# Patient Record
Sex: Male | Born: 1950 | Race: Black or African American | Hispanic: No | Marital: Married | Smoking: Never smoker
Health system: Southern US, Community
[De-identification: ages and names within clinical notes are randomized; demographics above are authoritative.]

## PROBLEM LIST (undated history)

## (undated) DIAGNOSIS — R011 Cardiac murmur, unspecified: Secondary | ICD-10-CM

## (undated) DIAGNOSIS — I10 Essential (primary) hypertension: Secondary | ICD-10-CM

## (undated) DIAGNOSIS — T8859XA Other complications of anesthesia, initial encounter: Secondary | ICD-10-CM

## (undated) DIAGNOSIS — E782 Mixed hyperlipidemia: Secondary | ICD-10-CM

## (undated) DIAGNOSIS — R0602 Shortness of breath: Secondary | ICD-10-CM

## (undated) DIAGNOSIS — T4145XA Adverse effect of unspecified anesthetic, initial encounter: Secondary | ICD-10-CM

## (undated) DIAGNOSIS — I82409 Acute embolism and thrombosis of unspecified deep veins of unspecified lower extremity: Secondary | ICD-10-CM

## (undated) HISTORY — DX: Acute embolism and thrombosis of unspecified deep veins of unspecified lower extremity: I82.409

## (undated) HISTORY — PX: CATARACT EXTRACTION, BILATERAL: SHX1313

## (undated) HISTORY — PX: RETINAL DETACHMENT SURGERY: SHX105

## (undated) HISTORY — DX: Shortness of breath: R06.02

## (undated) HISTORY — DX: Mixed hyperlipidemia: E78.2

## (undated) HISTORY — DX: Cardiac murmur, unspecified: R01.1

---
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
## 2001-03-22 ENCOUNTER — Encounter: Payer: Self-pay | Admitting: Emergency Medicine

## 2001-03-22 ENCOUNTER — Emergency Department (HOSPITAL_COMMUNITY): Admission: EM | Admit: 2001-03-22 | Discharge: 2001-03-22 | Payer: Self-pay | Admitting: Emergency Medicine

## 2004-05-20 ENCOUNTER — Ambulatory Visit (HOSPITAL_COMMUNITY): Admission: RE | Admit: 2004-05-20 | Discharge: 2004-05-20 | Payer: Self-pay | Admitting: Ophthalmology

## 2004-11-21 ENCOUNTER — Ambulatory Visit: Payer: Self-pay | Admitting: Internal Medicine

## 2004-12-09 ENCOUNTER — Ambulatory Visit: Payer: Self-pay | Admitting: Internal Medicine

## 2004-12-09 ENCOUNTER — Ambulatory Visit (HOSPITAL_COMMUNITY): Admission: RE | Admit: 2004-12-09 | Discharge: 2004-12-09 | Payer: Self-pay | Admitting: Internal Medicine

## 2006-05-27 ENCOUNTER — Ambulatory Visit (HOSPITAL_COMMUNITY): Admission: RE | Admit: 2006-05-27 | Discharge: 2006-05-27 | Payer: Self-pay | Admitting: Family Medicine

## 2009-08-31 DIAGNOSIS — I82409 Acute embolism and thrombosis of unspecified deep veins of unspecified lower extremity: Secondary | ICD-10-CM

## 2009-08-31 HISTORY — DX: Acute embolism and thrombosis of unspecified deep veins of unspecified lower extremity: I82.409

## 2009-09-10 ENCOUNTER — Ambulatory Visit: Payer: Self-pay | Admitting: Orthopedic Surgery

## 2009-09-10 DIAGNOSIS — M1710 Unilateral primary osteoarthritis, unspecified knee: Secondary | ICD-10-CM | POA: Insufficient documentation

## 2009-09-10 DIAGNOSIS — IMO0002 Reserved for concepts with insufficient information to code with codable children: Secondary | ICD-10-CM

## 2009-09-12 ENCOUNTER — Encounter (INDEPENDENT_AMBULATORY_CARE_PROVIDER_SITE_OTHER): Payer: Self-pay | Admitting: Orthopedic Surgery

## 2009-09-12 ENCOUNTER — Ambulatory Visit: Admission: RE | Admit: 2009-09-12 | Discharge: 2009-09-12 | Payer: Self-pay | Admitting: Orthopedic Surgery

## 2009-09-12 ENCOUNTER — Ambulatory Visit: Payer: Self-pay | Admitting: Surgery

## 2009-09-17 ENCOUNTER — Encounter (INDEPENDENT_AMBULATORY_CARE_PROVIDER_SITE_OTHER): Payer: Self-pay | Admitting: Cardiovascular Disease

## 2009-09-17 ENCOUNTER — Ambulatory Visit: Payer: Self-pay | Admitting: Critical Care Medicine

## 2009-09-17 ENCOUNTER — Inpatient Hospital Stay (HOSPITAL_COMMUNITY): Admission: EM | Admit: 2009-09-17 | Discharge: 2009-09-25 | Payer: Self-pay | Admitting: Cardiovascular Disease

## 2009-09-20 ENCOUNTER — Ambulatory Visit: Payer: Self-pay | Admitting: Vascular Surgery

## 2009-09-20 ENCOUNTER — Encounter (INDEPENDENT_AMBULATORY_CARE_PROVIDER_SITE_OTHER): Payer: Self-pay | Admitting: Specialist

## 2009-09-21 ENCOUNTER — Encounter: Payer: Self-pay | Admitting: Critical Care Medicine

## 2009-10-08 ENCOUNTER — Ambulatory Visit: Payer: Self-pay | Admitting: Oncology

## 2009-11-08 ENCOUNTER — Ambulatory Visit (HOSPITAL_COMMUNITY): Admission: RE | Admit: 2009-11-08 | Discharge: 2009-11-08 | Payer: Self-pay | Admitting: Cardiology

## 2009-11-12 ENCOUNTER — Ambulatory Visit: Payer: Self-pay | Admitting: Oncology

## 2009-11-14 ENCOUNTER — Encounter: Payer: Self-pay | Admitting: Critical Care Medicine

## 2009-11-14 LAB — COMPREHENSIVE METABOLIC PANEL
AST: 23 U/L (ref 0–37)
Alkaline Phosphatase: 52 U/L (ref 39–117)
BUN: 13 mg/dL (ref 6–23)
CO2: 28 mEq/L (ref 19–32)
Sodium: 139 mEq/L (ref 135–145)
Total Bilirubin: 0.8 mg/dL (ref 0.3–1.2)

## 2009-11-14 LAB — CBC WITH DIFFERENTIAL/PLATELET
BASO%: 0.8 % (ref 0.0–2.0)
Basophils Absolute: 0 10*3/uL (ref 0.0–0.1)
EOS%: 4.6 % (ref 0.0–7.0)
Eosinophils Absolute: 0.2 10*3/uL (ref 0.0–0.5)
HCT: 37.3 % — ABNORMAL LOW (ref 38.4–49.9)
LYMPH%: 21.9 % (ref 14.0–49.0)
MONO#: 0.4 10*3/uL (ref 0.1–0.9)
MONO%: 8.1 % (ref 0.0–14.0)
RDW: 15.2 % — ABNORMAL HIGH (ref 11.0–14.6)

## 2009-11-14 LAB — D-DIMER, QUANTITATIVE: D-Dimer, Quant: 0.81 ug/mL-FEU — ABNORMAL HIGH (ref 0.00–0.48)

## 2009-11-14 LAB — PROTHROMBIN TIME
INR: 1.07 (ref <1.50)
Prothrombin Time: 13.8 seconds (ref 11.6–15.2)

## 2009-11-14 LAB — MORPHOLOGY

## 2009-11-14 LAB — LACTATE DEHYDROGENASE: LDH: 151 U/L (ref 94–250)

## 2009-11-19 LAB — PROTEIN S, ANTIGEN, FREE: Protein S Ag, Free: 65 % normal (ref 57–171)

## 2009-11-30 ENCOUNTER — Ambulatory Visit (HOSPITAL_COMMUNITY)
Admission: RE | Admit: 2009-11-30 | Discharge: 2009-11-30 | Payer: Self-pay | Source: Home / Self Care | Admitting: Cardiology

## 2009-11-30 DIAGNOSIS — D649 Anemia, unspecified: Secondary | ICD-10-CM

## 2009-11-30 DIAGNOSIS — T79A0XA Compartment syndrome, unspecified, initial encounter: Secondary | ICD-10-CM | POA: Insufficient documentation

## 2009-11-30 DIAGNOSIS — J479 Bronchiectasis, uncomplicated: Secondary | ICD-10-CM

## 2009-11-30 DIAGNOSIS — I82409 Acute embolism and thrombosis of unspecified deep veins of unspecified lower extremity: Secondary | ICD-10-CM | POA: Insufficient documentation

## 2009-11-30 DIAGNOSIS — I10 Essential (primary) hypertension: Secondary | ICD-10-CM | POA: Insufficient documentation

## 2009-11-30 DIAGNOSIS — E119 Type 2 diabetes mellitus without complications: Secondary | ICD-10-CM | POA: Insufficient documentation

## 2009-12-04 ENCOUNTER — Ambulatory Visit: Payer: Self-pay | Admitting: Internal Medicine

## 2009-12-05 ENCOUNTER — Telehealth (INDEPENDENT_AMBULATORY_CARE_PROVIDER_SITE_OTHER): Payer: Self-pay | Admitting: *Deleted

## 2010-01-02 ENCOUNTER — Ambulatory Visit (HOSPITAL_COMMUNITY): Admission: RE | Admit: 2010-01-02 | Discharge: 2010-01-02 | Payer: Self-pay | Admitting: Cardiovascular Disease

## 2010-02-07 ENCOUNTER — Ambulatory Visit: Payer: Self-pay | Admitting: Oncology

## 2010-02-11 LAB — CBC WITH DIFFERENTIAL/PLATELET
Basophils Absolute: 0 10*3/uL (ref 0.0–0.1)
EOS%: 4.6 % (ref 0.0–7.0)
Eosinophils Absolute: 0.3 10*3/uL (ref 0.0–0.5)
HCT: 41.6 % (ref 38.4–49.9)
HGB: 13.5 g/dL (ref 13.0–17.1)
LYMPH%: 32.7 % (ref 14.0–49.0)
MCHC: 32.5 g/dL (ref 32.0–36.0)
MCV: 71.4 fL — ABNORMAL LOW (ref 79.3–98.0)
MONO#: 0.6 10*3/uL (ref 0.1–0.9)
NEUT%: 50.9 % (ref 39.0–75.0)
Platelets: 183 10*3/uL (ref 140–400)
RBC: 5.82 10*6/uL (ref 4.20–5.82)
RDW: 14.3 % (ref 11.0–14.6)

## 2010-02-11 LAB — FERRITIN: Ferritin: 173 ng/mL (ref 22–322)

## 2010-02-11 LAB — IRON AND TIBC
TIBC: 262 ug/dL (ref 215–435)
UIBC: 182 ug/dL

## 2010-06-02 HISTORY — PX: KNEE ARTHROSCOPY: SHX127

## 2010-06-02 HISTORY — PX: LEG SURGERY: SHX1003

## 2010-06-23 ENCOUNTER — Encounter: Payer: Self-pay | Admitting: Cardiology

## 2010-07-02 NOTE — Progress Notes (Signed)
Summary: returning call  Phone Note Call from Patient Call back at Work Phone [phone]   Caller: Patient Call For: wert Summary of Call: Returning Leslie's call. Initial call taken by: Darletta Moll,  December 05, 2009 2:42 PM  Follow-up for Phone Call        pt aware of cxr results Follow-up by: Philipp Deputy CMA,  December 05, 2009 2:51 PM

## 2010-07-02 NOTE — Assessment & Plan Note (Signed)
Summary: LT KNEE PAIN,SWELLING/NEEDS XRAY/[facility]F   Visit Type:  new patient  CC:  left knee pain.  History of Present Illness: 60 years old African American male pain in the LEFT knee times a week.  This started with some swelling in the back of the knee progressed to pain and swelling in the calf.  The pain is described as dull worse with squatting seemed to come on suddenly better with ice.  He does get some catching and swelling.  It's better now.   Xrays today.  Medications: Metoprolol 50mg , Benicar 20mg , Niaspan ER 1,000 mg, Januvia 100mg , Lipitor 20mg , Ferrex 150.  Past History:  Past Surgical History: Tonsillectomy Detached retina x2 Cataract removed x2 Removed Para thyroid  Review of Systems Constitutional:  Denies weight loss, weight gain, fever, chills, and fatigue. Cardiovascular:  Complains of palpitations; denies chest pain, fainting, and murmurs. Respiratory:  Complains of snoring; denies short of breath, wheezing, couch, tightness, pain on inspiration, and snoring . Gastrointestinal:  Denies heartburn, nausea, vomiting, diarrhea, constipation, and blood in your stools. Genitourinary:  Denies frequency, urgency, difficulty urinating, painful urination, flank pain, and bleeding in urine. Neurologic:  Denies numbness, tingling, unsteady gait, dizziness, tremors, and seizure. Musculoskeletal:  Complains of joint pain, swelling, and stiffness; denies instability, redness, heat, and muscle pain. Endocrine:  Denies excessive thirst, exessive urination, and heat or cold intolerance. Psychiatric:  Denies nervousness, depression, anxiety, and hallucinations. Skin:  Denies changes in the skin, poor healing, rash, itching, and redness. HEENT:  Denies blurred or double vision, eye pain, redness, and watering. Immunology:  Denies seasonal allergies, sinus problems, and allergic to bee stings. Hemoatologic:  Denies easy bleeding and brusing.  Physical Exam  Additional Exam:   GEN: well developed, well nourished, normal grooming and hygiene, no deformity and normal body habitus.   CDV: pulses are normal, no edema, no erythema. no tenderness  Lymph: normal lymph nodes   Skin: no rashes, skin lesions or open sores   NEURO: normal coordination, reflexes, sensation.   Psyche: awake, alert and oriented. Mood normal   Gait: normal  LEFT knee the calf is swollen there appears to be just a slight knee joint effusion.  There is no joint line tenderness there is some crepitus on range of motion.  The patella has a grinding sensation on compression but no pain.  Range of motion is full.  Meniscal signs are negative, motor exam is normal.  Knee is stable  RIGHT knee has full range of motion no swelling or tenderness     Impression & Recommendations:  Problem # 1:  ARTHRITIS, LEFT KNEE (ICD-716.96) Assessment New  laboratory x-rays of the knee LEFT  Mild medial joint space narrowing, no osteophytes, minimal deformity.  Impression mild osteoarthritis LEFT knee  Orders: New Patient Level III (62952) Knee x-ray,  3 views (84132)  Patient Instructions: 1)  Please schedule a follow-up appointment as needed.

## 2010-07-02 NOTE — Assessment & Plan Note (Signed)
Summary: Pulmonary/ post hosp f/u bronchiectasis   Visit Type:  Hospital Follow-up Primary Provider/Referring Provider:  [facility] in [place]  CC:  Post hospital followup.  Pt states that he is doing well and denies any complaints today.Marland Kitchen  History of Present Illness: 60 yobm never smoker with bronchiectasis presenting with hemoptysis while on anticoagulation.  December 04, 2009  1st pulmonary office eval for f/u rul pna in setting of bronchiectasis complicated by hemoptysis while on anticoagulation.  Post hospital followup.  Pt states that he is doing well and denies any complaints today.  Pt denies any significant sore throat, dysphagia, itching, sneezing,  nasal congestion or excess secretions,  fever, chills, sweats, unintended wt loss, pleuritic or exertional cp, hempoptysis, change in activity tolerance  orthopnea pnd or leg swelling over baseline  Current Medications (verified): 1)  Niaspan 1000 Mg Cr-Tabs (Niacin (Antihyperlipidemic)) .Marland Kitchen.. 1 Once Daily 2)  Benicar 20 Mg Tabs (Olmesartan Medoxomil) .Marland Kitchen.. 1 Once Daily 3)  Aspirin 81 Mg Tbec (Aspirin) .Marland Kitchen.. 1 Once Daily 4)  Januvia 100 Mg Tabs (Sitagliptin Phosphate) .Marland Kitchen.. 1 Once Daily 5)  Lipitor 20 Mg Tabs (Atorvastatin Calcium) .Marland Kitchen.. 1 At Bedtime 6)  Toprol Xl 50 Mg Xr24h-Tab (Metoprolol Succinate) .Marland Kitchen.. 1 Once Daily 7)  Nu-Iron 150 Mg Caps (Polysaccharide Iron Complex) .Marland Kitchen.. 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)     - dx September 12 2009 ARTHRITIS, LEFT KNEE (ICD-716.96)  Family History: Metstatic kidney CA- Father  Social  History: Married Administrator, arts work Never smoker Occ ETOH  Vital Signs:  Patient profile:   60 year old male Height:      75 inches Weight:      220 pounds BMI:     27.60 O2 Sat:      98 % on Room air Temp:     98.2 degrees F oral Pulse rate:   65 / minute BP sitting:   130 / 62  (right arm) Cuff size:   large  Vitals Entered ByVernie Murders (December 04, 2009 11:40 AM)  O2 Flow:  Room air  Physical Exam  Additional Exam:  wt 220 December 04, 2009  Ambulatory healthy appearing bm nad  in no acute distress. HEENT: nl dentition, turbinates, and orophanx. Nl external ear canals without cough reflex Neck without JVD/Nodes/TM Lungs clear to A and P bilaterally without cough on insp or exp maneuvers RRR no s3 or murmur or increase in P2 Abd soft and benign with nl excursion in the supine position. No bruits or organomegaly Ext warm without calf tenderness, cyanosis clubbing or edema Skin warm and dry without lesions  .   CXR  Procedure date:  12/04/2009  Findings:      Comparison: 09/25/2009   Findings: Heart size is normal.   No pleural effusion or pulmonary edema.   No airspace consolidation identified.   The trachea appears midline.   The trachea manually and the bronchiectasis is better seen on CT from 09/21/2009.   IMPRESSION:   1.  No acute cardiopulmonary abnormalities. No change from 09/25/2009.  Impression & Recommendations:  Problem # 1:  BRONCHIECTASIS (ICD-494.0) I  had an extended discussion with the patient today lasting 15 to 20 minutes of a 25 minute visit on the following issues:  natural hx and complications reviewed.  See instructions for specific recommendations   Medications Added to Medication List This Visit: 1)  Niaspan 1000 Mg Cr-tabs (Niacin (antihyperlipidemic)) .Marland Kitchen.. 1 once daily 2)  Benicar 20 Mg Tabs (Olmesartan medoxomil) .Marland Kitchen.. 1 once daily 3)  Aspirin 81 Mg Tbec (Aspirin) .Marland Kitchen.. 1 once daily 4)  Januvia 100 Mg Tabs (Sitagliptin  phosphate) .Marland Kitchen.. 1 once daily 5)  Lipitor 20 Mg Tabs (Atorvastatin calcium) .Marland Kitchen.. 1 at bedtime 6)  Toprol Xl 50 Mg Xr24h-tab (Metoprolol succinate) .Marland Kitchen.. 1 once daily 7)  Nu-iron 150 Mg Caps (Polysaccharide iron complex) .Marland Kitchen.. 1 once daily  Other Orders: T-2 View CXR (71020TC) Est. Patient Level IV (40102)  Patient Instructions: 1)  Baby aspirin is fine one daily but even it should be stopped immediately if you have any bleeding 2)  This condition = bronchiectasis  is associcated with pneumonia so you need pneumovax twice (if had it several years ago need it repeated 5 years from that point 3)  Seek help for pulmonary infections

## 2010-07-05 NOTE — Letter (Signed)
Summary: History form  History form   Imported By: Jacklynn Ganong 09/11/2009 13:14:02  _____________________________________________________________________  External Attachment:    Type:   Image     Comment:   External Document

## 2010-07-05 NOTE — Consult Note (Signed)
Summary: [place]  [place]   Imported By: Sherian Rein 12/05/2009 08:41:39  _____________________________________________________________________  External Attachment:    Type:   Image     Comment:   External Document

## 2010-08-01 DIAGNOSIS — R0602 Shortness of breath: Secondary | ICD-10-CM

## 2010-08-01 HISTORY — DX: Shortness of breath: R06.02

## 2010-08-20 LAB — ABO/RH: ABO/RH(D): O POS

## 2010-08-20 LAB — PROTIME-INR
INR: 1.07 (ref 0.00–1.49)
INR: 1.14 (ref 0.00–1.49)
INR: 1.83 — ABNORMAL HIGH (ref 0.00–1.49)
Prothrombin Time: 14.5 seconds (ref 11.6–15.2)
Prothrombin Time: 15.1 seconds (ref 11.6–15.2)
Prothrombin Time: 15.1 seconds (ref 11.6–15.2)
Prothrombin Time: 15.4 seconds — ABNORMAL HIGH (ref 11.6–15.2)

## 2010-08-20 LAB — GLUCOSE, CAPILLARY
Glucose-Capillary: 113 mg/dL — ABNORMAL HIGH (ref 70–99)
Glucose-Capillary: 114 mg/dL — ABNORMAL HIGH (ref 70–99)
Glucose-Capillary: 117 mg/dL — ABNORMAL HIGH (ref 70–99)
Glucose-Capillary: 123 mg/dL — ABNORMAL HIGH (ref 70–99)
Glucose-Capillary: 125 mg/dL — ABNORMAL HIGH (ref 70–99)
Glucose-Capillary: 128 mg/dL — ABNORMAL HIGH (ref 70–99)
Glucose-Capillary: 146 mg/dL — ABNORMAL HIGH (ref 70–99)
Glucose-Capillary: 156 mg/dL — ABNORMAL HIGH (ref 70–99)
Glucose-Capillary: 158 mg/dL — ABNORMAL HIGH (ref 70–99)
Glucose-Capillary: 158 mg/dL — ABNORMAL HIGH (ref 70–99)
Glucose-Capillary: 162 mg/dL — ABNORMAL HIGH (ref 70–99)
Glucose-Capillary: 164 mg/dL — ABNORMAL HIGH (ref 70–99)
Glucose-Capillary: 189 mg/dL — ABNORMAL HIGH (ref 70–99)
Glucose-Capillary: 190 mg/dL — ABNORMAL HIGH (ref 70–99)
Glucose-Capillary: 242 mg/dL — ABNORMAL HIGH (ref 70–99)
Glucose-Capillary: 252 mg/dL — ABNORMAL HIGH (ref 70–99)

## 2010-08-20 LAB — CBC
HCT: 26.2 % — ABNORMAL LOW (ref 39.0–52.0)
HCT: 26.8 % — ABNORMAL LOW (ref 39.0–52.0)
HCT: 27.2 % — ABNORMAL LOW (ref 39.0–52.0)
HCT: 27.7 % — ABNORMAL LOW (ref 39.0–52.0)
HCT: 28.6 % — ABNORMAL LOW (ref 39.0–52.0)
Hemoglobin: 11.1 g/dL — ABNORMAL LOW (ref 13.0–17.0)
Hemoglobin: 8.8 g/dL — ABNORMAL LOW (ref 13.0–17.0)
Hemoglobin: 8.9 g/dL — ABNORMAL LOW (ref 13.0–17.0)
MCHC: 32.3 g/dL (ref 30.0–36.0)
MCHC: 32.6 g/dL (ref 30.0–36.0)
MCHC: 32.9 g/dL (ref 30.0–36.0)
MCHC: 33.1 g/dL (ref 30.0–36.0)
MCHC: 33.1 g/dL (ref 30.0–36.0)
MCV: 74 fL — ABNORMAL LOW (ref 78.0–100.0)
MCV: 74.2 fL — ABNORMAL LOW (ref 78.0–100.0)
MCV: 75.3 fL — ABNORMAL LOW (ref 78.0–100.0)
MCV: 75.4 fL — ABNORMAL LOW (ref 78.0–100.0)
MCV: 75.5 fL — ABNORMAL LOW (ref 78.0–100.0)
Platelets: 146 10*3/uL — ABNORMAL LOW (ref 150–400)
Platelets: 155 10*3/uL (ref 150–400)
Platelets: 172 10*3/uL (ref 150–400)
Platelets: 187 10*3/uL (ref 150–400)
Platelets: 188 10*3/uL (ref 150–400)
RBC: 3.56 MIL/uL — ABNORMAL LOW (ref 4.22–5.81)
RBC: 3.84 MIL/uL — ABNORMAL LOW (ref 4.22–5.81)
RBC: 4.23 MIL/uL (ref 4.22–5.81)
RBC: 4.53 MIL/uL (ref 4.22–5.81)
RDW: 13.5 % (ref 11.5–15.5)
RDW: 13.7 % (ref 11.5–15.5)
RDW: 14.3 % (ref 11.5–15.5)
RDW: 14.4 % (ref 11.5–15.5)
RDW: 14.6 % (ref 11.5–15.5)
WBC: 7.5 10*3/uL (ref 4.0–10.5)
WBC: 8.5 10*3/uL (ref 4.0–10.5)
WBC: 8.6 10*3/uL (ref 4.0–10.5)
WBC: 8.8 10*3/uL (ref 4.0–10.5)
WBC: 9 10*3/uL (ref 4.0–10.5)

## 2010-08-20 LAB — BASIC METABOLIC PANEL
BUN: 10 mg/dL (ref 6–23)
BUN: 11 mg/dL (ref 6–23)
BUN: 11 mg/dL (ref 6–23)
BUN: 12 mg/dL (ref 6–23)
BUN: 9 mg/dL (ref 6–23)
CO2: 26 mEq/L (ref 19–32)
CO2: 27 mEq/L (ref 19–32)
CO2: 29 mEq/L (ref 19–32)
Calcium: 8.1 mg/dL — ABNORMAL LOW (ref 8.4–10.5)
Calcium: 8.4 mg/dL (ref 8.4–10.5)
Calcium: 8.5 mg/dL (ref 8.4–10.5)
Chloride: 102 mEq/L (ref 96–112)
Chloride: 106 mEq/L (ref 96–112)
Creatinine, Ser: 0.89 mg/dL (ref 0.4–1.5)
Creatinine, Ser: 0.92 mg/dL (ref 0.4–1.5)
Creatinine, Ser: 1.13 mg/dL (ref 0.4–1.5)
GFR calc Af Amer: >60 mL/min (ref >60)
GFR calc Af Amer: >60 mL/min (ref >60)
GFR calc non Af Amer: >60 mL/min (ref >60)
GFR calc non Af Amer: >60 mL/min (ref >60)
Glucose, Bld: 121 mg/dL — ABNORMAL HIGH (ref 70–99)
Glucose, Bld: 121 mg/dL — ABNORMAL HIGH (ref 70–99)
Glucose, Bld: 190 mg/dL — ABNORMAL HIGH (ref 70–99)
Glucose, Bld: 194 mg/dL — ABNORMAL HIGH (ref 70–99)
Potassium: 4 mEq/L (ref 3.5–5.1)
Potassium: 4.2 mEq/L (ref 3.5–5.1)
Potassium: 4.2 mEq/L (ref 3.5–5.1)
Potassium: 4.4 mEq/L (ref 3.5–5.1)
Sodium: 136 mEq/L (ref 135–145)
Sodium: 138 mEq/L (ref 135–145)

## 2010-08-20 LAB — COMPREHENSIVE METABOLIC PANEL
ALT: 66 U/L — ABNORMAL HIGH (ref 0–53)
AST: 40 U/L — ABNORMAL HIGH (ref 0–37)
Alkaline Phosphatase: 46 U/L (ref 39–117)
BUN: 9 mg/dL (ref 6–23)
CO2: 28 mEq/L (ref 19–32)
CO2: 29 mEq/L (ref 19–32)
Chloride: 104 mEq/L (ref 96–112)
Chloride: 106 mEq/L (ref 96–112)
Creatinine, Ser: 0.9 mg/dL (ref 0.4–1.5)
GFR calc Af Amer: >60 mL/min (ref >60)
GFR calc non Af Amer: >60 mL/min (ref >60)
GFR calc non Af Amer: >60 mL/min (ref >60)
Glucose, Bld: 166 mg/dL — ABNORMAL HIGH (ref 70–99)
Potassium: 4 mEq/L (ref 3.5–5.1)
Sodium: 137 mEq/L (ref 135–145)
Sodium: 139 mEq/L (ref 135–145)
Total Bilirubin: 0.5 mg/dL (ref 0.3–1.2)
Total Protein: 6.8 g/dL (ref 6.0–8.3)

## 2010-08-20 LAB — CROSSMATCH: Antibody Screen: NEGATIVE

## 2010-08-20 LAB — URINALYSIS, MICROSCOPIC ONLY
Ketones, ur: NEGATIVE mg/dL
Leukocytes, UA: NEGATIVE
Nitrite: NEGATIVE
Protein, ur: NEGATIVE mg/dL
Urobilinogen, UA: 0.2 mg/dL (ref 0.0–1.0)

## 2010-08-20 LAB — BRAIN NATRIURETIC PEPTIDE
Pro B Natriuretic peptide (BNP): 31 pg/mL (ref 0.0–100.0)
Pro B Natriuretic peptide (BNP): 52 pg/mL (ref 0.0–100.0)

## 2010-08-20 LAB — CULTURE, BLOOD (ROUTINE X 2)

## 2010-08-20 LAB — MAGNESIUM: Magnesium: 2.2 mg/dL (ref 1.5–2.5)

## 2010-08-20 LAB — URINE CULTURE
Colony Count: NO GROWTH
Culture: NO GROWTH

## 2010-08-20 LAB — TSH: TSH: 4.563 u[IU]/mL — ABNORMAL HIGH (ref 0.350–4.500)

## 2010-08-20 LAB — APTT
aPTT: 42 seconds — ABNORMAL HIGH (ref 24–37)
aPTT: 52 seconds — ABNORMAL HIGH (ref 24–37)

## 2010-08-20 LAB — T3, FREE: T3, Free: 2.4 pg/mL (ref 2.3–4.2)

## 2010-08-20 LAB — T4, FREE: Free T4: 1.06 ng/dL (ref 0.80–1.80)

## 2010-10-01 ENCOUNTER — Ambulatory Visit (INDEPENDENT_AMBULATORY_CARE_PROVIDER_SITE_OTHER): Payer: 59 | Admitting: Urology

## 2010-10-01 DIAGNOSIS — R3129 Other microscopic hematuria: Secondary | ICD-10-CM

## 2010-10-01 DIAGNOSIS — R972 Elevated prostate specific antigen [PSA]: Secondary | ICD-10-CM

## 2010-10-16 ENCOUNTER — Ambulatory Visit (HOSPITAL_COMMUNITY)
Admission: RE | Admit: 2010-10-16 | Discharge: 2010-10-16 | Disposition: A | Discharge status: Home / Self Care | Payer: 59 | Source: Ambulatory Visit | Attending: Family Medicine | Admitting: Family Medicine

## 2010-10-16 ENCOUNTER — Other Ambulatory Visit (HOSPITAL_COMMUNITY): Payer: Self-pay | Admitting: Family Medicine

## 2010-10-16 ENCOUNTER — Encounter (HOSPITAL_COMMUNITY): Payer: Self-pay

## 2010-10-16 DIAGNOSIS — R059 Cough, unspecified: Secondary | ICD-10-CM | POA: Insufficient documentation

## 2010-10-16 DIAGNOSIS — R05 Cough: Secondary | ICD-10-CM

## 2010-10-16 HISTORY — DX: Essential (primary) hypertension: I10

## 2010-10-18 NOTE — Consult Note (Signed)
NAME:  Jordan Goodwin, Jordan Goodwin NO.:  [id number]   MEDICAL RECORD NO.:  [id number]          PATIENT TYPE:  AMB   LOCATION:                                FACILITY:  [facility]   PHYSICIAN:  Lionel December, M.D.    DATE OF BIRTH:  06-Oct-1950   DATE OF CONSULTATION:  11/21/2004  DATE OF DISCHARGE:                                   CONSULTATION   GASTROENTEROLOGY CONSULTATION   REASON FOR CONSULTATION:  Rectal pain, colonoscopy.   REFERRING PHYSICIAN:  Melony Overly, PA with Patrica Duel, M.D.   CONSULTING PHYSICIAN:  Lionel December, M.D.   HISTORY OF PRESENT ILLNESS:  The patient is a 60 year old black gentleman  who presents today for further evaluation of rectal pain at the request of  Melony Overly, PA-C with Dr. Patrica Duel.  The patient was seen in their  office about one month ago. He had a one week history of rectal pain at that  time.  He felt fullness and pressure in his rectum.  Examination revealed  what appeared to be a somewhat firm prostate but no distinct nodules.  He  had never had a colonoscopy therefore it was recommended that he come to see  [place] and he has also been seen by the urologist, Dr. Earlene Plater.  He states his  prostate work up has been negative.  Currently he is not having any rectal  pressure or pain.  His bowel movements have been regular.  No melena or  rectal bleeding, abdominal pain, heart burn, weight loss.  He has had some  dysphagia intermittently for several months.  Sometimes he has to wash the  food down with liquid.  It tends to be worse with solid foods such as  breads, rice or meat.  He had partial parathyroidectomy in March of this  year but notes that his dysphagia was prior to this.   CURRENT MEDICATIONS:  1.  Toprol XL 50 mg daily.  2.  Lipitor 20 mg daily.   ALLERGIES:  No known drug allergies.   PAST MEDICAL HISTORY:  Irregular heart rate, hypercholesterolemia,  parathyroid disease, status post partial parathyroidectomy.  He has  had  detached retina in both eyes requiring surgery and bilateral cataract  surgery.   FAMILY HISTORY:  Father had cancer of kidney and lung, question primary.  No  family history of colorectal cancer.   SOCIAL HISTORY:  He is married.  He works at [employer]. He has never been  a smoker.  He occasionally consumes alcohol, usually socially.   REVIEW OF SYMPTOMS:  See history of present illness for GASTROINTESTINAL and  CONSTITUTIONAL.  CARDIOPULMONARY:  Denies chest pain or shortness of breath.   PHYSICAL EXAMINATION:  VITAL SIGNS:  Weight 233. Height 6 feet 3 inches,  temperature 98.3, blood pressure 152/96, pulse 70.  GENERAL APPEARANCE:  Pleasant, well-nourished, well-developed black  gentleman in no acute distress.  SKIN:  Warm and dry, no jaundice.  HEENT:  Conjunctiva pink.  Sclerae nonicteric.  Oropharyngeal mucosa moist  and pink.  No lesions, erythema or exudate.  NECK:  No lymphadenopathy or  thyromegaly.  CHEST:  Lungs clear to auscultation.  CARDIAC:  Examination reveals regular rate and rhythm with normal S1, S2, no  murmurs, rubs or gallops.  ABDOMEN:  Positive bowel sounds, soft, nontender, nondistended, no  organomegaly or masses.  No rebound tenderness or guarding.  No abdominal  bruits or hernias.  EXTREMITIES:  No edema.  RECTAL:  Examination deferred.   IMPRESSION:  Mr. Bothwell is a 60 year old gentleman with recent  development of rectal pain/pressure which at this time has resolved.  He has  never had a colonoscopy therefore recommend one for further evaluation and  as well for a screening maneuver.  He complains of intermittent dysphagia to  solid foods and may have developed esophageal stricture or ring.   PLAN:  Colonoscopy and esophagogastroduodenoscopy, possible esophageal  dilatation, in the near future.  I have discussed risks, alternatives and  benefits with the patient with regards to reactions to medications,  bleeding, infection, perforation  or incomplete examination with patient and  he is agreeable to proceed.       LL/MEDQ  D:  11/21/2004  T:  11/21/2004  Job:  [id number]

## 2010-10-18 NOTE — Op Note (Signed)
NAME:  Jordan Goodwin, Jordan Goodwin NO.:  [id number]   MEDICAL RECORD NO.:  [id number]          PATIENT TYPE:  AMB   LOCATION:  DAY                           FACILITY:  [facility]   PHYSICIAN:  Lionel December, M.D.    DATE OF BIRTH:  1950/10/03   DATE OF PROCEDURE:  12/09/2004  DATE OF DISCHARGE:                                 OPERATIVE REPORT   PROCEDURE:  Esophagogastroduodenoscopy with esophageal dilation followed by  colonoscopy.   INDICATION:  Jordan Goodwin is 60 year old Afro-American male with intermittent  dysphagia of one years' duration primarily to solids without heartburn. He  also gives history of rectal pain which has improved. He is undergoing  colonoscopy first primarily for screening purposes. Procedures were reviewed  the patient, informed consent was obtained.   PREMEDICATION:  Cetacaine spray for oropharyngeal topical anesthesia,  Demerol 50 milligrams IV, Versed 7 milligrams IV in divided dose.   FINDINGS:  Procedure performed in endoscopy suite. The patient's vital signs  and O2 sat were monitored during procedure and remained stable.   PROCEDURE #1:  Esophagogastroduodenoscopy. The patient was placed left  lateral position. Olympus videoscope was passed via oropharynx without any  difficulty into esophagus.   Esophagus. Mucosa of the esophagus was normal throughout. Squamocolumnar  junction was at 42 cm from incisors and appeared to be unremarkable. No  hernia was noted.   Duodenum.  Bulbar mucosa was normal.   Stomach was empty and distended very well with insufflation. Folds of the  proximal stomach were normal. Examination mucosa at body, antrum, pyloric  channel as well as angularis, fundus and cardia was normal.   Duodenum bulbar mucosa was normal. Scope was passed second part of duodenum  where mucosa and folds were normal. Endoscope was withdrawn.   Esophagus was dilated by passing 56-French Maloney dilator to full  insertion. As the dilator was  withdrawn, endoscope was passed. Again there  was a small tear at cervical esophagus indicative of the web. Endoscope was  withdrawn. The patient prepared for procedure #2.   Colonoscopy. Rectal examination performed. No abnormality noted external or  digital exam. Olympus videoscope was placed rectum and advanced under vision  into sigmoid colon beyond. Preparation was excellent. There were few small  scattered diverticula at sigmoid and descending colon. Scope was passed  cecum which was identified by appendiceal orifice and ileocecal valve.  Pictures taken for the record. As the scope was withdrawn, colonic mucosa  was examined for the second time and there were no polyps or other  abnormalities. Rectal mucosa similarly was normal. Scope was retroflexed to  examine anorectal junction which was unremarkable. Endoscope was  straightened and withdrawn. The patient tolerated the procedure well.   FINAL DIAGNOSIS:  Normal esophagogastroduodenoscopy. Passage of 56-French  [facility] dilator.   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  Few small diverticula  at sigmoid and descending colon.   RECOMMENDATIONS:  High-fiber diet. Citrucel one tablespoonful daily. Levsin  SL one to tablets t.i.d. p.r.n. prescription given for 30 doses.   If the rectal  pain recurs will consider further workup.       NR/MEDQ  D:  12/09/2004  T:  12/09/2004  Job:  [id number]   cc:   Melony Overly, PA   Patrica Duel, M.D.  [address]  [place]  [place] [address]  Fax: [phone]

## 2011-04-08 ENCOUNTER — Ambulatory Visit (INDEPENDENT_AMBULATORY_CARE_PROVIDER_SITE_OTHER): Payer: 59 | Admitting: Urology

## 2011-04-08 DIAGNOSIS — R972 Elevated prostate specific antigen [PSA]: Secondary | ICD-10-CM

## 2011-04-08 DIAGNOSIS — R3129 Other microscopic hematuria: Secondary | ICD-10-CM

## 2012-05-25 ENCOUNTER — Ambulatory Visit (INDEPENDENT_AMBULATORY_CARE_PROVIDER_SITE_OTHER): Payer: 59 | Admitting: Urology

## 2012-05-25 DIAGNOSIS — R972 Elevated prostate specific antigen [PSA]: Secondary | ICD-10-CM

## 2012-06-23 IMAGING — US US EXTREM LOW VENOUS*L*
1 series · 14 of 24 positions shown · non-contrast
Comparison: 11/30/2009

CLINICAL DATA: Pain and swelling left lower extremity, history of
compartment syndrome

LEFT LOWER EXTREMITY VENOUS DUPLEX ULTRASOUND
TECHNIQUE: Gray-scale sonography with graded compression, as well
as color Doppler and duplex ultrasound, were performed to evaluate
the deep venous system of the lower extremity from the level of the
common femoral vein through the popliteal and proximal calf veins.
Spectral Doppler was utilized to evaluate flow at rest and with
distal augmentation maneuvers.

[Series 1: us extrem low venous*left* · 14 of 33 slices shown]
[im 1/33]
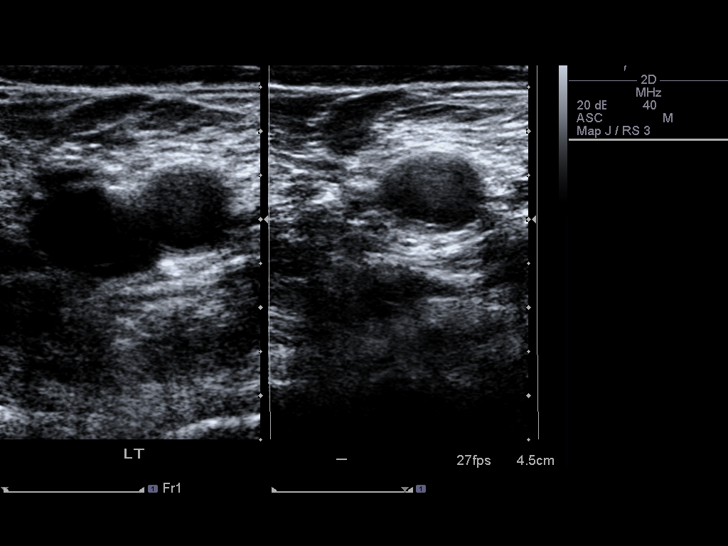
[im 3/33]
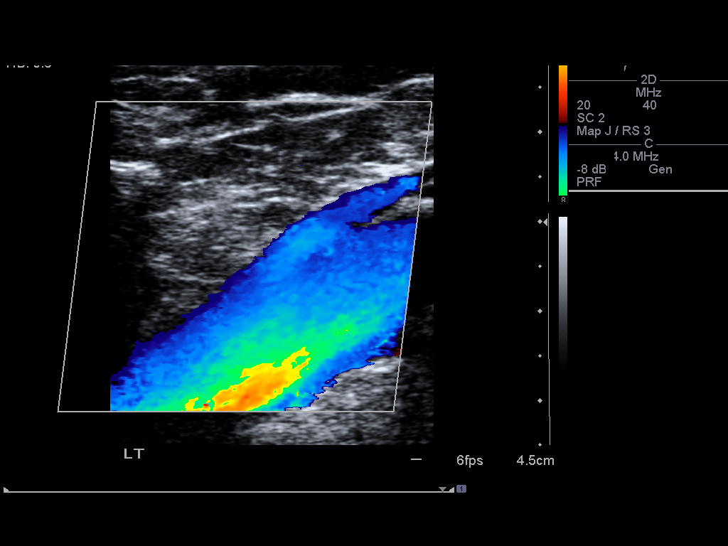
[im 6/33]
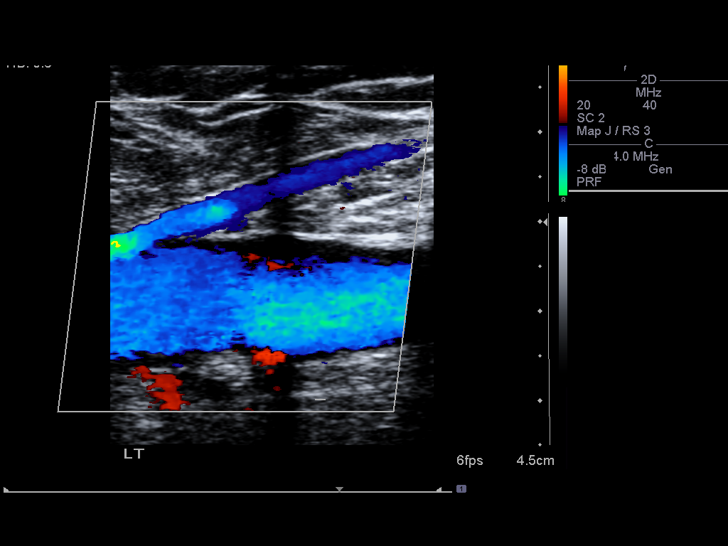
[im 9/33]
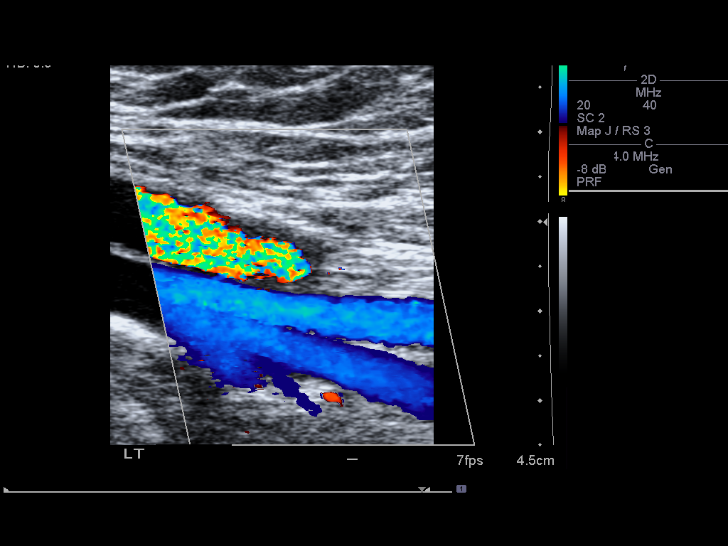
[im 10/33]
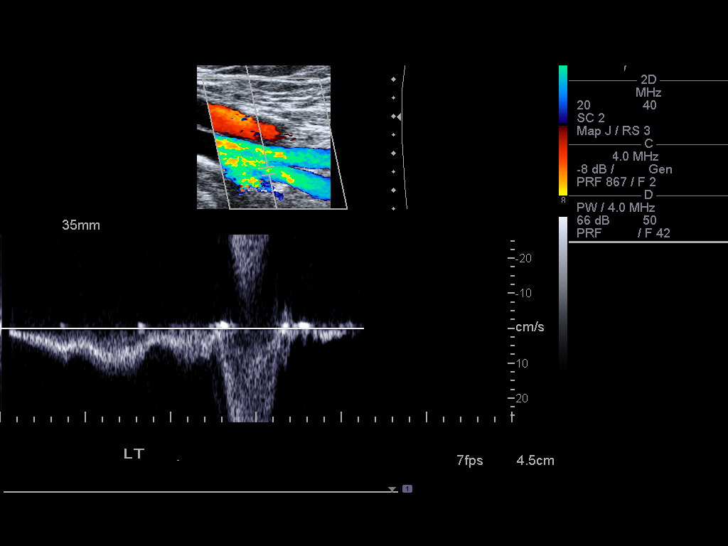
[im 13/33]
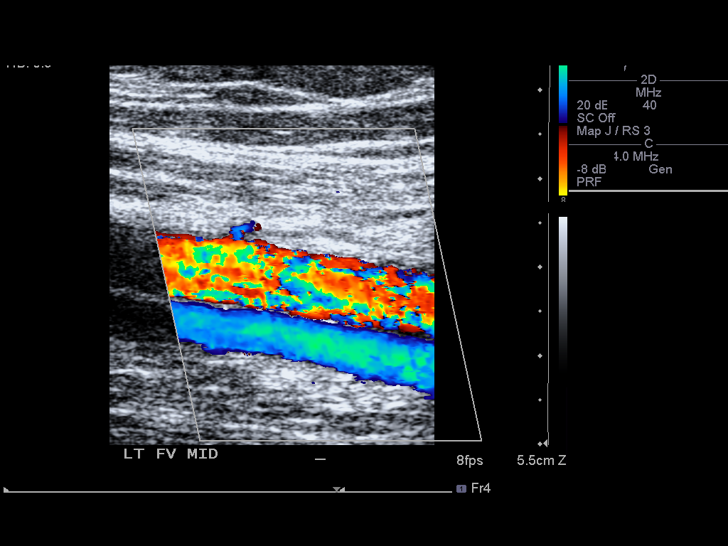
[im 16/33]
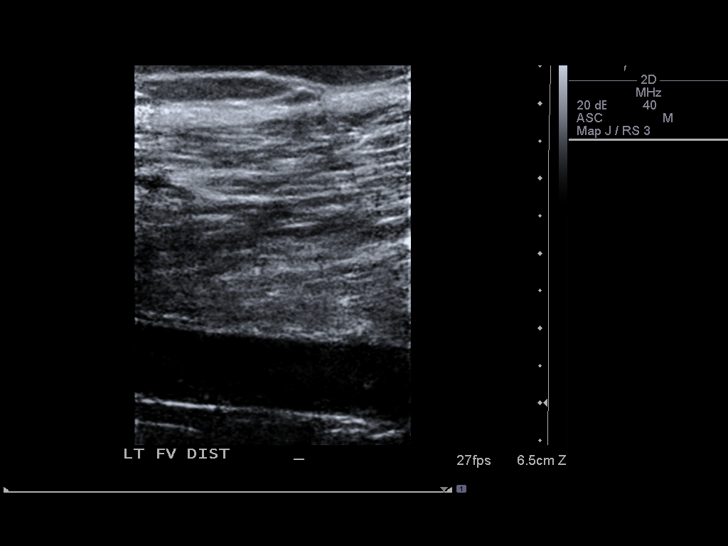
[im 17/33]
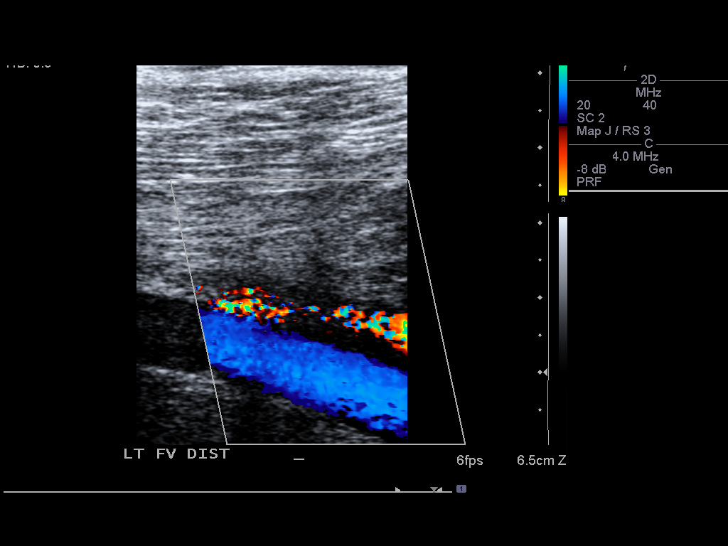
[im 20/33]
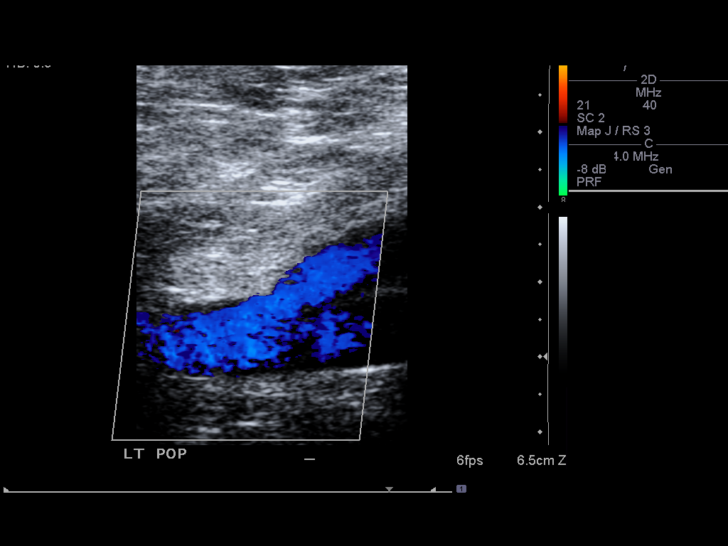
[im 23/33]
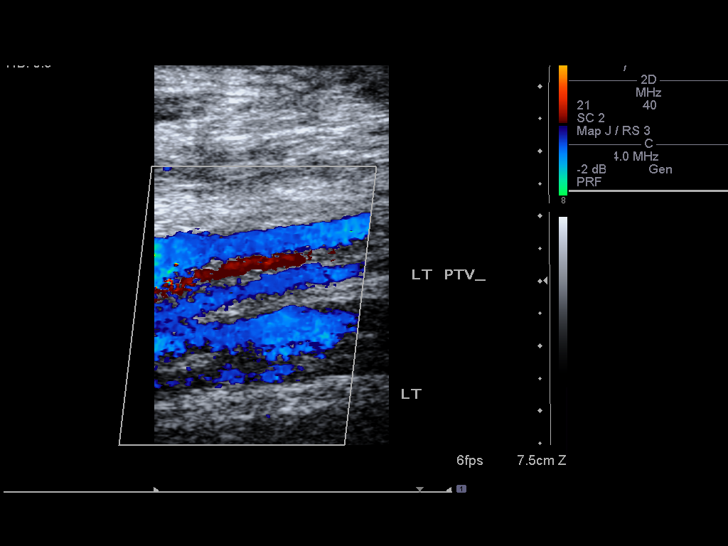
[im 26/33]
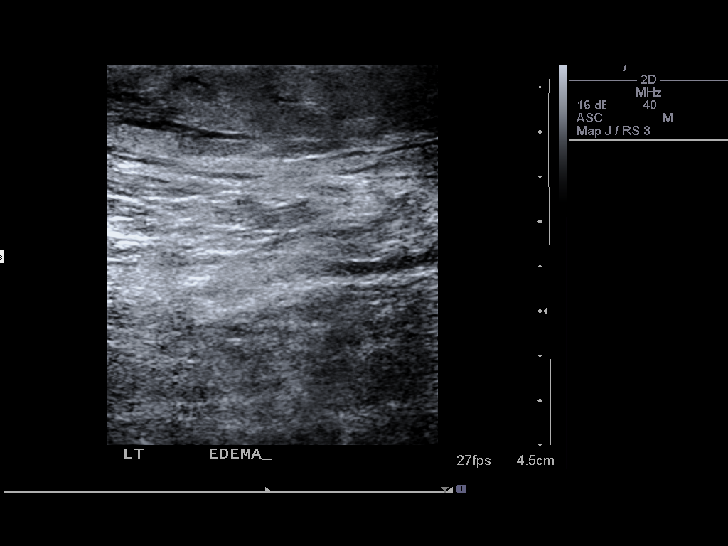
[im 27/33]
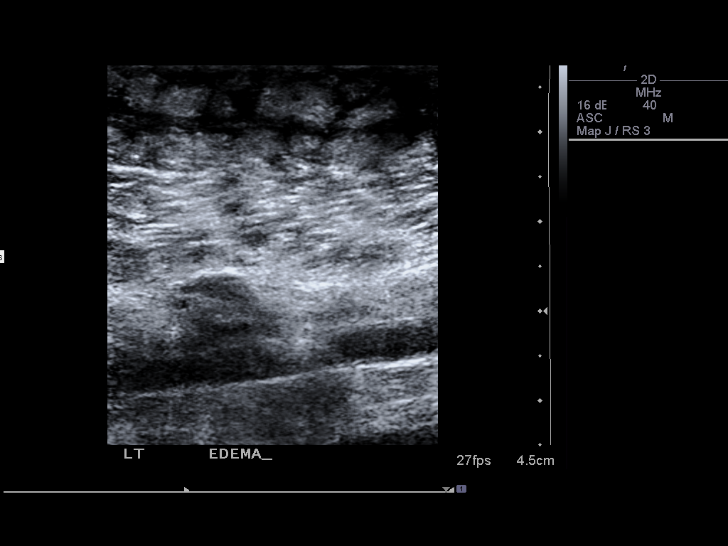
[im 30/33]
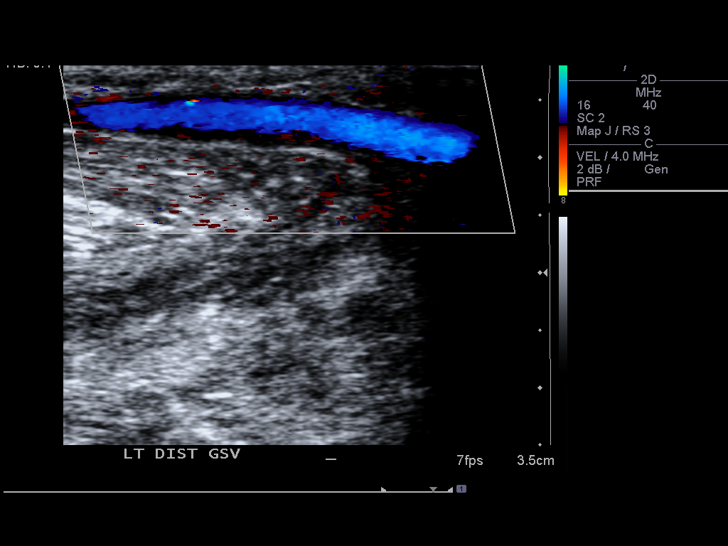
[im 33/33]
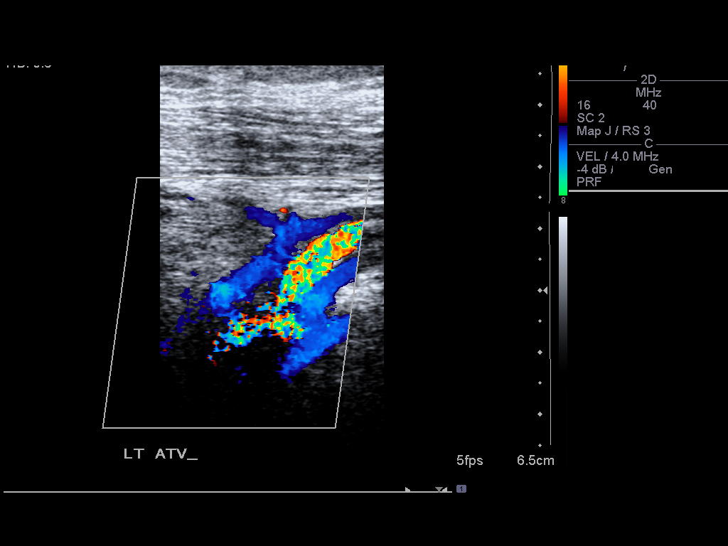

[14 of 24 positions shown; findings below may reference images not displayed]

FINDINGS: Deep venous system patent and compressible from left groin through
popliteal fossa.
Spontaneous venous flow present with intact augmentation and
evidence of respiratory phasicity.
No intraluminal thrombus identified.
Visualized portion of the greater saphenous system unremarkable.
Moderate subcutaneous edema identified at left calf.
Previously identified left Baker's cyst not definitely visualized
on current exam.
IMPRESSION: No evidence of deep venous thrombosis in left lower extremity.

## 2012-08-16 ENCOUNTER — Encounter: Payer: Self-pay | Admitting: *Deleted

## 2012-10-12 ENCOUNTER — Encounter: Payer: Self-pay | Admitting: Cardiovascular Disease

## 2012-10-19 ENCOUNTER — Encounter: Payer: Self-pay | Admitting: Cardiovascular Disease

## 2012-10-19 ENCOUNTER — Ambulatory Visit (INDEPENDENT_AMBULATORY_CARE_PROVIDER_SITE_OTHER): Payer: 59 | Admitting: Cardiovascular Disease

## 2012-10-19 VITALS — BP 160/84 | HR 59 | Ht 72.0 in | Wt 229.8 lb

## 2012-10-19 DIAGNOSIS — I82409 Acute embolism and thrombosis of unspecified deep veins of unspecified lower extremity: Secondary | ICD-10-CM

## 2012-10-19 DIAGNOSIS — I10 Essential (primary) hypertension: Secondary | ICD-10-CM

## 2012-10-19 DIAGNOSIS — E039 Hypothyroidism, unspecified: Secondary | ICD-10-CM | POA: Insufficient documentation

## 2012-10-19 DIAGNOSIS — E785 Hyperlipidemia, unspecified: Secondary | ICD-10-CM | POA: Insufficient documentation

## 2012-10-19 MED ORDER — LOSARTAN POTASSIUM 50 MG PO TABS: 50.0000 mg | ORAL_TABLET | Freq: Two times a day (BID) | ORAL | Status: DC

## 2012-10-19 NOTE — Patient Instructions (Addendum)
Your physician recommends that you schedule a follow-up appointment in: 6 months. You will receive a reminder letter in the mail in about 4 months reminding you to call and schedule your appointment. If you don't receive this letter, please contact our office. Your physician has recommended you make the following change in your medication: Increase your losartan 50 mg to twice daily. Your new prescription has been sent to your pharmacy. All other medications will remain the same. Your physician recommends that you return for lab work in: 2 weeks. Please have a BMET done at [facility].

## 2012-10-19 NOTE — Progress Notes (Signed)
Patient ID: Jordan Goodwin, male   DOB: 1950-12-21, 62 y.o.   MRN: [id number]  HPI: Jordan Goodwin, is a 62 y.o. male presents to the save for one year Cardiologic followup evaluation.  Jordan Goodwin has a history of left lower extremity DVT in April 2011 involving the left posterior tibial vein. He was treated with Lovenox and Coumadin and subsequently developed a calf hematoma leading to compartment syndrome requiring fasciotomy on 09/18/2009 the he also had a history of spontaneous pulmonary hemorrhage 09/22/2009. 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 He has history of diabetes mellitus and remotely he has had elevated PSAs felt  to be due to infection rather than cancer. Her tells me over the past year, he was taken off his Benicar 20 mg and this was substituted by his insurance company  with losartan 50 mg. He has noticed that his blood pressure has been slightly elevated. He is unaware of any chest pain. He denies recent shortness of breath. Additional problems also include type 2 diabetes mellitus the he does note some fatigability. He presents to the office today for evaluation.  Past Medical History  Diagnosis Date  . Hypertension   . Diabetes mellitus   . Mixed hyperlipidemia     low HDL  . 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 DVT (deep venous thrombosis) 08/2009    left lower extremity and had a DVT inthe left posterior tibial vein  . Murmur     Past Surgical History  Procedure Laterality Date  . Tonsillectomy  1962  . Retinal detachment surgery      x's 2  . Cataract extraction, bilateral    . 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 Knee arthroscopy  2012    left knee/torn meniscus    Not on File  Current Outpatient Prescriptions  Medication Sig Dispense Refill  . aspirin 81 MG EC tablet Take 81 mg by mouth daily. Swallow  whole.      Marland Kitchen atorvastatin (LIPITOR) 20 MG tablet Take 20 mg by mouth daily.      Marland Kitchen glucosamine-chondroitin 500-400 MG tablet Take 1 tablet by mouth 2 (two) times daily.      Marland Kitchen levothyroxine (SYNTHROID, LEVOTHROID) 25 MCG tablet Take 25 mcg by mouth daily before breakfast.      . losartan (COZAAR) 50 MG tablet Take 1 tablet (50 mg total) by mouth 2 (two) times daily.  60 tablet  6  . metoprolol succinate (TOPROL-XL) 50 MG 24 hr tablet Take 50 mg by mouth daily. Take with or immediately following a meal.      . niacin (NIASPAN) 1000 MG CR tablet Take 1,000 mg by mouth at bedtime.      . sitaGLIPtin (JANUVIA) 100 MG tablet Take 100 mg by mouth daily.       No current facility-administered medications for this visit.   Socially he is married and has one child age 56. He exercises approximately 3-4 days per week. There is no tobacco history. He works as an Art gallery manager for [employer].  ROV is negative for fever chills or night sweats. He does wear glasses. He denies any change in shortness of breath. Denies chest pain to denies wheezing. He denies palpitations. There is no presyncope or syncope. He denies recent bleeding episodes. He denies paresthesias. Denies any recent urinary  tract infections. He has had normal PSAs since his PSA elevation and had been followed by Jordan Goodwin. Other system review is negative.  PE BP 160/84  Pulse 59  Ht 6' (1.829 m)  Wt 229 lb 12 oz (104.214 kg)  BMI 31.15 kg/m2  General: Alert, oriented, no distress.  HEENT: Normocephalic, atraumatic. Pupils round and reactive; sclera anicteric;  Mouth/Parynx benign; Mallinpatti scale 3 Neck: No JVD, no carotid briuts Lungs: clear to ausculatation and percussion; no wheezing or rales Heart: RRR, s1 s2 normal 1/6 diastolic murmur in the aortic position suggestive of AR. Abdomen: soft, nontender; no hepatosplenomehaly, BS+; abdominal aorta nontender and not dilated by palpation. Pulses 2+ Extremities: no clubbinbg cyanosis  or edema, Homan's sign negative  Neurologic: grossly nonfocal   ECG: SB at 59. Nonspecific T changes. Normal intervals.  LABS: Basic Metabolic Panel: No results found for this basename: NA, K, CL, CO2, GLUCOSE, BUN, CREATININE, CALCIUM, MG, PHOS,  in the last 72 hours Liver Function Tests: No results found for this basename: AST, ALT, ALKPHOS, BILITOT, PROT, ALBUMIN,  in the last 72 hours No results found for this basename: LIPASE, AMYLASE,  in the last 72 hours CBC: No results found for this basename: WBC, NEUTROABS, HGB, HCT, MCV, PLT,  in the last 72 hours Cardiac Enzymes: No results found for this basename: CKTOTAL, CKMB, CKMBINDEX, TROPONINI,  in the last 72 hours BNP: No components found with this basename: POCBNP,  D-Dimer: No results found for this basename: DDIMER,  in the last 72 hours Hemoglobin A1C: No results found for this basename: HGBA1C,  in the last 72 hours Fasting Lipid Panel: No results found for this basename: CHOL, HDL, LDLCALC, TRIG, CHOLHDL, LDLDIRECT,  in the last 72 hours Thyroid Function Tests: No results found for this basename: TSH, T4TOTAL, FREET3, T3FREE, THYROIDAB,  in the last 72 hours Anemia Panel: No results found for this basename: VITAMINB12, FOLATE, FERRITIN, TIBC, IRON, RETICCTPCT,  in the last 72 hours  RADIOLOGY: No results found.    ASSESSMENT AND PLAN: Clinically, Jordan Goodwin has been doing well with reference to his remote history of DVT he does have stay 2 hypertension today and I suspect this may be the result of changing him from Benicar to losartan 50 mg. I am recommending that he titrate his losartan to 50 mg twice a day for more optimal blood pressure control. He is on combination therapy for his mixed hyperlipidemia. Apparently he did have laboratory we checked by his primary physician in February 2014. I am trying to obtain these results, and once available additional recommendations will be made. He apparently also had been  started on levothyroxine over the past year. A cardiovascular perspective he is doing well with the exception of blood pressure control. He is exercising approximately 3-4 days per week. His last echo Doppler study in March 2012 did reveal mild to moderate aortic regurgitation. He did have mild concentric LVH with normal systolic function. I'm recommending a followup of the neck be obtained in 2 weeks allowing the increase in his losartan regimen. As long as he remains stable, I will see him in 6 months for Cardiologic followup assessment.      Yuriko Portales A 10/19/2012 12:56 PM

## 2012-10-26 ENCOUNTER — Other Ambulatory Visit: Payer: Self-pay | Admitting: *Deleted

## 2012-10-26 MED ORDER — LOSARTAN POTASSIUM 50 MG PO TABS: 50.0000 mg | ORAL_TABLET | Freq: Two times a day (BID) | ORAL | Status: DC

## 2012-10-26 NOTE — Telephone Encounter (Signed)
Rx refill for losartan #90

## 2012-11-09 LAB — BASIC METABOLIC PANEL
CO2: 26 mEq/L (ref 19–32)
Chloride: 104 mEq/L (ref 96–112)
Sodium: 141 mEq/L (ref 135–145)

## 2012-11-11 ENCOUNTER — Telehealth: Payer: Self-pay | Admitting: *Deleted

## 2012-11-11 NOTE — Telephone Encounter (Signed)
Patient notified of lab results.Marland KitchenMarland KitchenMarland KitchenGlucose ^, however it is lower than the previous one.

## 2012-11-19 NOTE — Progress Notes (Signed)
Quick Note:  Informed patient patient Dr. Tresa Endo reviewed his lab results and felt everything is WNL except his blood sugar. Blood sugar # given to patient so that he can inform his endocrinologist. ______

## 2013-02-01 ENCOUNTER — Other Ambulatory Visit: Payer: Self-pay | Admitting: Cardiovascular Disease

## 2013-02-02 NOTE — Telephone Encounter (Signed)
Rx was sent to pharmacy electronically. 

## 2013-02-03 ENCOUNTER — Other Ambulatory Visit: Payer: Self-pay | Admitting: Cardiovascular Disease

## 2013-02-04 ENCOUNTER — Encounter: Payer: Self-pay | Admitting: *Deleted

## 2013-02-04 NOTE — Telephone Encounter (Signed)
Rx was sent to pharmacy electronically. 

## 2013-03-17 ENCOUNTER — Telehealth: Payer: Self-pay | Admitting: Cardiovascular Disease

## 2013-03-17 NOTE — Telephone Encounter (Signed)
Returned call and pt verified x 2.  Pt with complaint as stated below.  Denied symptoms.  Stated BP has been up since Dr. Tresa Endo changed his meds at last visit.  Offered appt to be seen tomorrow and pt declined.  Stated he will be out of town tomorrow through [place] or Wed.  Pt informed he should have BP checked and meds adjusted.  Stated he could come in tomorrow at 8am if able to work in.  [place], Teacher, early years/pre, notified and advised pt could be seen if he is here right at 8am.  Pt verbalized understanding and agreed w/ plan.  Scheduling added pt on at 8am for BP check.  Pt agreed to bring in ALL meds to appt.

## 2013-03-17 NOTE — Telephone Encounter (Signed)
Had a [employer] at work today and his BP 180/69  They advised him to call our office.  Please call.  Jordan Goodwin believes BP medicine

## 2013-03-18 ENCOUNTER — Encounter: Payer: Self-pay | Admitting: Pharmacist Clinician (PhC)/ Clinical Pharmacy Specialist

## 2013-03-18 ENCOUNTER — Ambulatory Visit (INDEPENDENT_AMBULATORY_CARE_PROVIDER_SITE_OTHER): Payer: 59 | Admitting: Pharmacist Clinician (PhC)/ Clinical Pharmacy Specialist

## 2013-03-18 VITALS — BP 180/86 | HR 64 | Ht 75.0 in | Wt 225.3 lb

## 2013-03-18 DIAGNOSIS — I10 Essential (primary) hypertension: Secondary | ICD-10-CM

## 2013-03-18 MED ORDER — IRBESARTAN 300 MG PO TABS: 300.0000 mg | ORAL_TABLET | Freq: Every day | ORAL | Status: DC

## 2013-03-18 NOTE — Progress Notes (Signed)
03/18/2013 Dorise Hiss May 29, 1951 [id number]   HPI:  Jordan Goodwin is a 62 y.o. African American male with a PMH below who presents today for a blood pressure check.  He went to a health fair at work yesterday and was found to have a BP of 180/70.  He was advised to call his MD for an appointment right away, and we were able to work him in this am.  He has a history of hypertension going back 5-6 years and was successfully controlled on Benicar for many years.  Unfortunately, within the last year his insurance company switched him from [employer] 20mg  to losartan 50mg .  When he saw Dr. Tresa Endo in May his BP was 160/84 and Dr. Tresa Endo increased the losartan to bid for better control.  Mr Kellman states that he has had no problems with headaches/blurred vision and had no idea that his BP was elevated until the health fair yesterday.   He uses no tobacco products, only drinks the occasional alcoholic beverage, and works out at a fitness center 3 days per week (30 min on treadmill and 15-30 min with weights).  He does eat about 1/2 of his meals in restaurants, including fast food.  Mr. Mendiola is a diabetic, his last A1c from February 2014 was 6.5.  He has no known drug allergies, but apparently developed compartment syndrome after starting warfarin for a blood clot several years ago.     Current Outpatient Prescriptions  Medication Sig Dispense Refill  . aspirin 81 MG EC tablet Take 81 mg by mouth daily. Swallow whole.      Marland Kitchen atorvastatin (LIPITOR) 20 MG tablet Take 20 mg by mouth daily.      . celecoxib (CELEBREX) 200 MG capsule Take 200 mg by mouth daily as needed for pain.      Marland Kitchen glucosamine-chondroitin 500-400 MG tablet Take 1 tablet by mouth 2 (two) times daily.      . irbesartan (AVAPRO) 300 MG tablet Take 1 tablet (300 mg total) by mouth at bedtime.  30 tablet  1  . levothyroxine (SYNTHROID, LEVOTHROID) 25 MCG tablet Take 25 mcg by mouth daily before breakfast.      . metoprolol  succinate (TOPROL-XL) 50 MG 24 hr tablet Take 50 mg by mouth daily. Take with or immediately following a meal.      . niacin (NIASPAN) 1000 MG CR tablet TAKE 1 TABLET BY MOUTH AT BEDTIME  90 tablet  3  . sitaGLIPtin (JANUVIA) 100 MG tablet Take 100 mg by mouth daily.       No current facility-administered medications for this visit.    Not on File  Past Medical History  Diagnosis Date  . Hypertension   . Diabetes mellitus   . Mixed hyperlipidemia     low HDL  . 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 DVT (deep venous thrombosis) 08/2009    left lower extremity and had a DVT inthe left posterior tibial vein  . Murmur     Blood pressure 180/86, pulse 64, height 6\' 3"  (1.905 m), weight 225 lb 4.8 oz (102.195 kg).   ASSESSMENT AND PLAN:  Mr. Hocevar has stage 2 hypertension.  Because he is diabetic, I will use the ADA goal of <140/80 to treat him.  Today I will switch him from losartan 50mg  bid to irbesartan 300mg  qd.  I have asked him to take this each night.  He will continue with  the metoprolol each am.  We had a discussion about exercise and sodium restrictions helping to control BP, and with as many meals as he eats in restaurants I encouraged him to avoid the fast food restaurants and look for heart healthy/low sodium choices whenever possible.  We also discussed the avoidance of canned and boxed foods and I encouraged him to use fresh fruits and vegetables whenever possible.  I suspect that we will need to add to his BP regimen, possibly with amlodipine or chlorthalidone, but I will see him back in 3 weeks.  I have asked that he check his BP at the drugstore whenever possible and keep a recording of those readings to bring back to his next appointment.  Phillips Hay PharmD CPP  [facility]

## 2013-03-18 NOTE — Patient Instructions (Addendum)
Your blood pressure today is high at 180/86.  Take your BP meds as follows:  AM: metoprolol 50mg    PM: irbesartan 300mg    Bring all of your meds, your record of home blood pressures to your next appointment.  Exercise as you're able, try to walk approximately 30 minutes per day.  Keep salt intake to a minimum, especially watch canned and prepared boxed foods.  Eat more fresh fruits and vegetables and fewer canned items.  Avoid eating in fast food restaurants.    Return for follow up check in 3 weeks

## 2013-04-11 ENCOUNTER — Encounter: Payer: Self-pay | Admitting: Cardiovascular Disease

## 2013-04-11 ENCOUNTER — Encounter: Payer: 59 | Admitting: Pharmacist Clinician (PhC)/ Clinical Pharmacy Specialist

## 2013-04-11 ENCOUNTER — Ambulatory Visit (INDEPENDENT_AMBULATORY_CARE_PROVIDER_SITE_OTHER): Payer: 59 | Admitting: Cardiovascular Disease

## 2013-04-11 VITALS — BP 160/90 | HR 55 | Ht 75.0 in | Wt 224.1 lb

## 2013-04-11 DIAGNOSIS — D582 Other hemoglobinopathies: Secondary | ICD-10-CM

## 2013-04-11 DIAGNOSIS — E785 Hyperlipidemia, unspecified: Secondary | ICD-10-CM

## 2013-04-11 DIAGNOSIS — E119 Type 2 diabetes mellitus without complications: Secondary | ICD-10-CM

## 2013-04-11 DIAGNOSIS — I10 Essential (primary) hypertension: Secondary | ICD-10-CM

## 2013-04-11 DIAGNOSIS — I82409 Acute embolism and thrombosis of unspecified deep veins of unspecified lower extremity: Secondary | ICD-10-CM

## 2013-04-11 DIAGNOSIS — E039 Hypothyroidism, unspecified: Secondary | ICD-10-CM

## 2013-04-11 MED ORDER — IRBESARTAN-HYDROCHLOROTHIAZIDE 300-25 MG PO TABS: 1.0000 | ORAL_TABLET | Freq: Every day | ORAL | Status: DC

## 2013-04-11 NOTE — Patient Instructions (Signed)
Your physician has recommended you make the following change in your medication: STOP the Irbesartan. Start the new prescription given for Irbesartan-hct 300-25 This has already been sent to the pharmacy.  Your physician has requested that you have an echocardiogram. Echocardiography is a painless test that uses sound waves to create images of your heart. It provides your doctor with information about the size and shape of your heart and how well your heart's chambers and valves are working. This procedure takes approximately one hour. There are no restrictions for this procedure. This will be done in February 2015.  Your physician recommends that you return for lab work in: 2 weeks. Your physician recommends that you schedule a follow-up appointment in: 6 MONTHS.

## 2013-04-11 NOTE — Progress Notes (Signed)
Patient ID: Jordan Goodwin, male   DOB: 10-08-1950, 62 y.o.   MRN: [id number]                                                                                                                                                            HPI: Jordan Goodwin, is a 62 y.o. male presents to the office for followup evaluation.  Mr. Partin has a history of left lower extremity DVT in April 2011 involving the left posterior tibial vein. He was treated with Lovenox and Coumadin and subsequently developed a calf hematoma leading to compartment syndrome requiring fasciotomy on 09/18/2009 the he also had a history of spontaneous pulmonary hemorrhage 09/22/2009. 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 He has history of diabetes mellitus and remotely he has had elevated PSAs felt  to be due to infection rather than cancer. Over the past year, he was taken off his Benicar 20 mg and this was substituted by his insurance company  with losartan 50 mg. when he was last seen in May, his dose was tried titrate to 100 mg daily. Subsequently, this still did not control his blood pressure and apparently he was switched to [place] start him 300 mg daily. He has been on this new medication for approximately 3 weeks. His blood pressure has improved but still remains in the 160 range. He states in the past his blood pressure was well controlled with Benicar but unfortunately the insurance company denied this treatment.  He denies chest pain. He denies shortness of breath. He does have a history of type 2 diabetes mellitus. He does have some fatigability. He denies recent bleeding.  Past Medical History  Diagnosis Date  . Hypertension   . Diabetes mellitus   . Mixed hyperlipidemia     low HDL  . 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 DVT (deep venous thrombosis) 08/2009    left lower extremity and had a DVT  inthe left posterior tibial vein  . Murmur     Past Surgical History  Procedure Laterality Date  . Tonsillectomy  1962  . Retinal detachment surgery      x's 2  . Cataract extraction, bilateral    . 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 Knee arthroscopy  2012    left knee/torn meniscus    Allergies  Allergen Reactions  . Warfarin And Related     Compartment syndrome    Current Outpatient Prescriptions  Medication Sig Dispense Refill  . aspirin 81 MG EC tablet Take 81 mg by mouth daily. Swallow whole.      Marland Kitchen atorvastatin (LIPITOR) 20 MG tablet  Take 20 mg by mouth daily.      Marland Kitchen glucosamine-chondroitin 500-400 MG tablet Take 1 tablet by mouth 2 (two) times daily.      . irbesartan (AVAPRO) 300 MG tablet Take 1 tablet (300 mg total) by mouth at bedtime.  30 tablet  1  . levothyroxine (SYNTHROID, LEVOTHROID) 50 MCG tablet Take 50 mcg by mouth daily before breakfast.      . metoprolol succinate (TOPROL-XL) 50 MG 24 hr tablet Take 50 mg by mouth daily. Take with or immediately following a meal.      . niacin (NIASPAN) 1000 MG CR tablet TAKE 1 TABLET BY MOUTH AT BEDTIME  90 tablet  3  . sitaGLIPtin (JANUVIA) 100 MG tablet Take 100 mg by mouth daily.       No current facility-administered medications for this visit.   Socially he is married and has one child age 61. He exercises approximately 3-4 days per week. There is no tobacco history. He works as an Art gallery manager for [employer].  ROV is negative for fever chills or night sweats. He does wear glasses. He denies any change in shortness of breath. Denies chest pain to denies wheezing. There is no cough or sputum production. He denies palpitations. There is no presyncope or syncope. He denies chest pressure. He denies recent bleeding episodes. He denies paresthesias. He denies abdominal pain, nausea vomiting or diarrhea. Denies any recent urinary tract infections. He has had normal PSAs since his PSA elevation and  had been followed by Dr. Letha Cape. He is unaware of recent leg swelling. He denies rash. He denies skin lesions. He denies tremors. He does have diabetes. Other comprehensive 12 system review is negative.  PE BP 160/90  Pulse 55  Ht 6\' 3"  (1.905 m)  Wt 224 lb 1.6 oz (101.651 kg)  BMI 28.01 kg/m2  General: Alert, oriented, no distress.  HEENT: Normocephalic, atraumatic. Pupils round and reactive; sclera anicteric;  Mouth/Parynx benign; Mallinpatti scale 3 Neck: No JVD, no carotid briuts Lungs: clear to ausculatation and percussion; no wheezing or rales Heart: RRR, s1 s2 normal 1-2/6 diastolic murmur in the aortic position suggestive of AR. Abdomen: soft, nontender; no hepatosplenomehaly, BS+; abdominal aorta nontender and not dilated by palpation. Pulses 2+ Extremities: no clubbinbg cyanosis or edema, Homan's sign negative  Neurologic: grossly nonfocal   ECG: Sinus bradycardia 55 beats per minute.  Nonspecific T changes. Normal intervals.  LABS: Basic Metabolic Panel: No results found for this basename: NA, K, CL, CO2, GLUCOSE, BUN, CREATININE, CALCIUM, MG, PHOS,  in the last 72 hours Liver Function Tests: No results found for this basename: AST, ALT, ALKPHOS, BILITOT, PROT, ALBUMIN,  in the last 72 hours No results found for this basename: LIPASE, AMYLASE,  in the last 72 hours CBC: No results found for this basename: WBC, NEUTROABS, HGB, HCT, MCV, PLT,  in the last 72 hours Cardiac Enzymes: No results found for this basename: CKTOTAL, CKMB, CKMBINDEX, TROPONINI,  in the last 72 hours BNP: No components found with this basename: POCBNP,  D-Dimer: No results found for this basename: DDIMER,  in the last 72 hours Hemoglobin A1C: No results found for this basename: HGBA1C,  in the last 72 hours Fasting Lipid Panel: No results found for this basename: CHOL, HDL, LDLCALC, TRIG, CHOLHDL, LDLDIRECT,  in the last 72 hours Thyroid Function Tests: No results found for this basename:  TSH, T4TOTAL, FREET3, T3FREE, THYROIDAB,  in the last 72 hours Anemia Panel: No results found for this basename:  VITAMINB12, FOLATE, FERRITIN, TIBC, IRON, RETICCTPCT,  in the last 72 hours  RADIOLOGY: No results found.    ASSESSMENT AND PLAN:    Mr. Sundt has been doing well with reference to his remote history of DVT. He continues to have hypertension despite switching his ARB to irbesartan 300 mg. He also is on Toprol-XL 50 mg daily and is bradycardic at 55 beats per minute. I am now recommending changing his irbesartan 300 mg 2 or certain HCT 300/25 for optimal blood pressure control. He does have a 2/1-2/6 murmur of aortic insufficiency. His last echo Doppler study was in mid March 2012 which did show mild to moderate aortic regurgitation. At that time he did have mild concentric left hypertrophy with an ejection fraction greater than 55% and normal Doppler parameters. We will recheck again in 2 weeks with the initiation of HCTZ added to irbesartan. He will ollowup with Dr. Sherwood Gambler. In 6 months, I will see him back in the office in 6 months prior to that office visit he will undergo a 3 year followup echo Doppler study to reassess systolic diastolic function and valvular architecture. He is on levothyroxin for  his hypothyroidism 50 mcg daily and also is on Januvia for diabetes. He does have mixed hyperlipidemia and is tolerating atorvastatin 20 mg and Niaspan 1000 mg daily. Prior to that office his repeat laboratory will also be obtained.    Janyra Barillas A 04/11/2013 9:54 AM

## 2013-04-12 ENCOUNTER — Encounter: Payer: Self-pay | Admitting: Cardiovascular Disease

## 2013-04-12 ENCOUNTER — Telehealth: Payer: Self-pay | Admitting: Cardiovascular Disease

## 2013-04-12 MED ORDER — IRBESARTAN-HYDROCHLOROTHIAZIDE 300-12.5 MG PO TABS: 1.0000 | ORAL_TABLET | Freq: Every day | ORAL | Status: DC

## 2013-04-12 NOTE — Telephone Encounter (Signed)
Need to see if it is all right for the patient to take Avalide 300/12.5? It does not come in 300/25.Please call today.

## 2013-04-12 NOTE — Telephone Encounter (Signed)
Dr. Tresa Endo notified and advised change to Avalide 300/12.5mg .  Rx sent to pharmacy.

## 2013-05-02 LAB — BASIC METABOLIC PANEL
BUN: 19 mg/dL (ref 6–23)
CO2: 25 mEq/L (ref 19–32)
Chloride: 105 mEq/L (ref 96–112)
Creat: 0.99 mg/dL (ref 0.50–1.35)

## 2013-05-22 ENCOUNTER — Encounter: Payer: Self-pay | Admitting: *Deleted

## 2013-05-24 ENCOUNTER — Ambulatory Visit (INDEPENDENT_AMBULATORY_CARE_PROVIDER_SITE_OTHER): Payer: 59 | Admitting: Urology

## 2013-05-24 DIAGNOSIS — R972 Elevated prostate specific antigen [PSA]: Secondary | ICD-10-CM

## 2013-08-12 ENCOUNTER — Other Ambulatory Visit: Payer: Self-pay

## 2013-08-12 MED ORDER — METOPROLOL SUCCINATE ER 50 MG PO TB24
50.0000 mg | ORAL_TABLET | Freq: Every day | ORAL | Status: DC
Start: 2013-08-12 — End: 2013-11-07

## 2013-08-12 NOTE — Telephone Encounter (Signed)
Rx was sent to pharmacy electronically. 

## 2013-09-30 ENCOUNTER — Other Ambulatory Visit: Payer: Self-pay

## 2013-09-30 MED ORDER — ATORVASTATIN CALCIUM 20 MG PO TABS: 20.0000 mg | ORAL_TABLET | Freq: Every day | ORAL | Status: DC

## 2013-09-30 NOTE — Telephone Encounter (Signed)
Rx was sent to pharmacy electronically. 

## 2013-10-05 ENCOUNTER — Other Ambulatory Visit: Payer: Self-pay | Admitting: *Deleted

## 2013-10-05 ENCOUNTER — Encounter: Payer: Self-pay | Admitting: *Deleted

## 2013-10-05 DIAGNOSIS — E785 Hyperlipidemia, unspecified: Secondary | ICD-10-CM

## 2013-10-05 DIAGNOSIS — E119 Type 2 diabetes mellitus without complications: Secondary | ICD-10-CM

## 2013-10-05 DIAGNOSIS — I10 Essential (primary) hypertension: Secondary | ICD-10-CM

## 2013-10-05 DIAGNOSIS — E039 Hypothyroidism, unspecified: Secondary | ICD-10-CM

## 2013-10-12 LAB — NMR LIPOPROFILE WITH LIPIDS
Cholesterol, Total: 134 mg/dL (ref <200)
HDL Particle Number: 22.8 umol/L — ABNORMAL LOW (ref >=30.5)
HDL Size: 8.5 nm — ABNORMAL LOW (ref >=9.2)
HDL-C: 43 mg/dL (ref >=40)
LARGE HDL: 1.8 umol/L — AB (ref >=4.8)
LARGE VLDL-P: 0.9 nmol/L (ref <=2.7)
LDL (calc): 81 mg/dL (ref <100)
LDL PARTICLE NUMBER: 1299 nmol/L — AB (ref <1000)
LDL Size: 20.3 nm — ABNORMAL LOW (ref >20.5)
LP-IR Score: 51 — ABNORMAL HIGH (ref <=45)
Small LDL Particle Number: 751 nmol/L — ABNORMAL HIGH (ref <=527)
TRIGLYCERIDES: 48 mg/dL (ref <150)
VLDL SIZE: 44.1 nm (ref <=46.6)

## 2013-10-12 LAB — COMPREHENSIVE METABOLIC PANEL
ALBUMIN: 4.4 g/dL (ref 3.5–5.2)
ALT: 17 U/L (ref 0–53)
AST: 20 U/L (ref 0–37)
Alkaline Phosphatase: 46 U/L (ref 39–117)
BUN: 14 mg/dL (ref 6–23)
CALCIUM: 9.1 mg/dL (ref 8.4–10.5)
CHLORIDE: 105 meq/L (ref 96–112)
CO2: 28 mEq/L (ref 19–32)
Creat: 0.9 mg/dL (ref 0.50–1.35)
GLUCOSE: 145 mg/dL — AB (ref 70–99)
POTASSIUM: 4 meq/L (ref 3.5–5.3)
SODIUM: 141 meq/L (ref 135–145)
Total Bilirubin: 0.6 mg/dL (ref 0.2–1.2)
Total Protein: 6.9 g/dL (ref 6.0–8.3)

## 2013-10-12 LAB — HEMOGLOBIN A1C
Hgb A1c MFr Bld: 6.7 % — ABNORMAL HIGH (ref <5.7)
Mean Plasma Glucose: 146 mg/dL — ABNORMAL HIGH (ref <117)

## 2013-10-12 LAB — CBC
HCT: 38.9 % — ABNORMAL LOW (ref 39.0–52.0)
HEMOGLOBIN: 13.1 g/dL (ref 13.0–17.0)
MCH: 23.1 pg — ABNORMAL LOW (ref 26.0–34.0)
MCHC: 33.7 g/dL (ref 30.0–36.0)
MCV: 68.7 fL — ABNORMAL LOW (ref 78.0–100.0)
Platelets: 183 10*3/uL (ref 150–400)
RBC: 5.66 MIL/uL (ref 4.22–5.81)
RDW: 15.5 % (ref 11.5–15.5)
WBC: 4.5 10*3/uL (ref 4.0–10.5)

## 2013-10-12 LAB — TSH: TSH: 6.644 u[IU]/mL — ABNORMAL HIGH (ref 0.350–4.500)

## 2013-10-17 ENCOUNTER — Encounter: Payer: Self-pay | Admitting: Cardiovascular Disease

## 2013-10-17 ENCOUNTER — Ambulatory Visit (INDEPENDENT_AMBULATORY_CARE_PROVIDER_SITE_OTHER): Payer: 59 | Admitting: Cardiovascular Disease

## 2013-10-17 VITALS — BP 144/80 | HR 59 | Ht 75.0 in | Wt 225.7 lb

## 2013-10-17 DIAGNOSIS — E119 Type 2 diabetes mellitus without complications: Secondary | ICD-10-CM

## 2013-10-17 DIAGNOSIS — D582 Other hemoglobinopathies: Secondary | ICD-10-CM

## 2013-10-17 DIAGNOSIS — I82409 Acute embolism and thrombosis of unspecified deep veins of unspecified lower extremity: Secondary | ICD-10-CM

## 2013-10-17 DIAGNOSIS — I350 Nonrheumatic aortic (valve) stenosis: Secondary | ICD-10-CM

## 2013-10-17 DIAGNOSIS — E785 Hyperlipidemia, unspecified: Secondary | ICD-10-CM

## 2013-10-17 DIAGNOSIS — I359 Nonrheumatic aortic valve disorder, unspecified: Secondary | ICD-10-CM

## 2013-10-17 DIAGNOSIS — E039 Hypothyroidism, unspecified: Secondary | ICD-10-CM

## 2013-10-17 DIAGNOSIS — I10 Essential (primary) hypertension: Secondary | ICD-10-CM

## 2013-10-17 MED ORDER — LEVOTHYROXINE SODIUM 75 MCG PO TABS: 75.0000 ug | ORAL_TABLET | Freq: Every day | ORAL | Status: DC

## 2013-10-17 NOTE — Progress Notes (Signed)
Patient ID: Jordan HissJohn L Goodwin, male   DOB: 1950-10-25, 63 y.o.   MRN: [id number]                                                                                                                                                 HPI: Jordan HissJohn L Goodwin is a 63 y.o. male who presents to the office for a 6 month followup evaluation.  Jordan Goodwin has a history of left lower extremity DVT in April 2011 involving the left posterior tibial vein. He was treated with Lovenox and Coumadin and subsequently developed a calf hematoma leading to compartment syndrome requiring fasciotomy on 09/18/2009 and had a spontaneous pulmonary hemorrhage 09/22/2009. 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 He has history of diabetes mellitus and remotely he has had elevated PSAs felt  to be due to infection rather than cancer. Over the past year, he was taken off his Benicar 20 mg and this was substituted by his insurance company  with losartan 50 mg  titrated to 100 mg daily. Subsequently, this still did not control his blood pressure and he was switched to research and hydrochlorothiazide 300/12.5 mg.    Jordan Goodwin denies any recent chest pain.  He did have an echo Doppler study in March 2012 with suggested mild to moderate aortic insufficiency.  Normal systolic function.  There was mild mitral regurgitation, mild tricuspid regurgitation.  Pulmonary pressure was normal.    He does have a history of hypothyroidism and recently has been on Synthroid at 50 mcg.  He also has a history of hyperlipidemia, in addition to type 2 diabetes mellitus.  Recent blood work also revealed a total cholesterol of 134, HDL 43, triglycerides 48, LDL 81.  However, LDL particle number was increased at 1299.   He denies shortness of breath.  He denies chest pain.  He does admit to fatigue.  He denies palpitations.  He presents for evaluation.   Past  Medical History  Diagnosis Date  . Hypertension   . Diabetes mellitus   . Mixed hyperlipidemia     low HDL  . 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 DVT (deep venous thrombosis) 08/2009    left lower extremity and had a DVT inthe left posterior tibial vein  . Murmur     Past Surgical History  Procedure Laterality Date  . Tonsillectomy  1962  . Retinal detachment surgery      x's 2  . Cataract extraction, bilateral    . 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 Knee arthroscopy  2012    left knee/torn meniscus    Allergies  Allergen Reactions  . Warfarin And Related     Compartment  syndrome    Current Outpatient Prescriptions  Medication Sig Dispense Refill  . aspirin 81 MG EC tablet Take 81 mg by mouth daily. Swallow whole.      Marland Kitchen. atorvastatin (LIPITOR) 20 MG tablet Take 20 mg by mouth daily.      Marland Kitchen. glucosamine-chondroitin 500-400 MG tablet Take 1 tablet by mouth 2 (two) times daily.      . irbesartan (AVAPRO) 300 MG tablet Take 1 tablet (300 mg total) by mouth at bedtime.  30 tablet  1  . levothyroxine (SYNTHROID, LEVOTHROID) 50 MCG tablet Take 50 mcg by mouth daily before breakfast.      . metoprolol succinate (TOPROL-XL) 50 MG 24 hr tablet Take 50 mg by mouth daily. Take with or immediately following a meal.      . niacin (NIASPAN) 1000 MG CR tablet TAKE 1 TABLET BY MOUTH AT BEDTIME  90 tablet  3  . sitaGLIPtin (JANUVIA) 100 MG tablet Take 100 mg by mouth daily.       No current facility-administered medications for this visit.   Socially he is married and has one child age 63. He exercises approximately 3-4 days per week. There is no tobacco history. 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   ROS General: Positive for fatigue; No fevers, chills, or night sweats;  HEENT: Negative; No changes in vision or hearing, sinus congestion, difficulty swallowing Pulmonary: Negative; No cough, wheezing, shortness of breath,  hemoptysis Cardiovascular: Negative; No chest pain, presyncope, syncope, palpatations GI: Negative; No nausea, vomiting, diarrhea, or abdominal pain GU: Negative; No dysuria, hematuria, or difficulty voiding Musculoskeletal: Negative; no myalgias, joint pain, or weakness Hematologic/Oncology: Negative; no easy bruising, bleeding Endocrine: Positive for diabetes , and hypothyroidism; no heat/cold intolerance;  Neuro: Negative; no changes in balance, headaches Skin: Negative; No rashes or skin lesions Psychiatric: Negative; No behavioral problems, depression Sleep: Negative; No snoring, daytime sleepiness, hypersomnolence, bruxism, restless legs, hypnogognic hallucinations, no cataplexy Other comprehensive 14 point system review is negative.   PE BP 160/90  Pulse 55  Ht 6\' 3"  (1.905 m)  Wt 224 lb 1.6 oz (101.651 kg)  BMI 28.01 kg/m2  General: Alert, oriented, no distress.  HEENT: Normocephalic, atraumatic. Pupils round and reactive; sclera anicteric;  Mouth/Parynx benign; Mallinpatti scale 3 Neck: No JVD, no carotid bruits; normal carotid upstroke Lungs: clear to ausculatation and percussion; no wheezing or rales Chest wall: Nontender to Heart: RRR, s1 s2 normal 1-2/6 diastolic murmur in the aortic position suggestive of AR.  No S3 or S4 gallop.  No rubs, thrills or heaves. Abdomen: soft, nontender; no hepatosplenomehaly, BS+; abdominal aorta nontender and not dilated by palpation. Back: No CVA tenderness Pulses 2+ Extremities: no clubbinbg cyanosis or edema, Homan's sign negative  Neurologic: grossly nonfocal Psychological: Normal affect and mood  ECG (inattentively read by me): Sinus rhythm at 59 beats per minute.  Borderline first-degree block with PR interval 206 ms.  No significant ST changes.   Prior ECG: Sinus bradycardia 55 beats per minute.  Nonspecific T changes. Normal intervals.  LABS: Basic Metabolic Panel: No results found for this basename: NA, K, CL, CO2,  GLUCOSE, BUN, CREATININE, CALCIUM, MG, PHOS,  in the last 72 hours Liver Function Tests: No results found for this basename: AST, ALT, ALKPHOS, BILITOT, PROT, ALBUMIN,  in the last 72 hours No results found for this basename: LIPASE, AMYLASE,  in the last 72 hours CBC: No results found for this basename: WBC, NEUTROABS, HGB, HCT, MCV, PLT,  in the  last 72 hours Cardiac Enzymes: No results found for this basename: CKTOTAL, CKMB, CKMBINDEX, TROPONINI,  in the last 72 hours BNP: No components found with this basename: POCBNP,  D-Dimer: No results found for this basename: DDIMER,  in the last 72 hours Hemoglobin A1C: No results found for this basename: HGBA1C,  in the last 72 hours Fasting Lipid Panel: No results found for this basename: CHOL, HDL, LDLCALC, TRIG, CHOLHDL, LDLDIRECT,  in the last 72 hours Thyroid Function Tests: No results found for this basename: TSH, T4TOTAL, FREET3, T3FREE, THYROIDAB,  in the last 72 hours Anemia Panel: No results found for this basename: VITAMINB12, FOLATE, FERRITIN, TIBC, IRON, RETICCTPCT,  in the last 72 hours  RADIOLOGY: No results found.    ASSESSMENT AND PLAN:    Jordan Goodwin is a 63 year old gentleman with a remote history of DVT and is doing well with reference to this.  His blood pressure today is better controlled when rechecked by me was 122/78 on his current dose of irbesartan hydrochlorothiazide 300/12.5.  In addition to Toprol-XL 50 mg.  Recent blood work was reviewed with him in detail.  His TSH was elevated at 6.644 on his current dose of Synthroid 50 mcg, suggestive of residual, hypothyroidism, and I have recommended he increase his Synthroid to 75 mcg.  Followup thyroid function assessment will be done in 4 weeks.  He is tolerating atorvastatin 20 mg and Niaspan for his mixed hyperlipidemia.  He is on Januvia for his diabetes and recent hemoglobin A1c was 6.7.  Cardiac exam does suggest a 1-2/6 aortic insufficiency murmur.  His last echo  Doppler study was in March 2012.  I will see him in 6 months for followup evaluation prior to that office visit.  He will undergo a followup echo Doppler study.  KELLY,THOMAS A 04/11/2013 9:54 AM

## 2013-10-17 NOTE — Patient Instructions (Addendum)
Your physician recommends that you return for lab work in: 4 weeks.  Your physician has recommended you make the following change in your medication: increase the levorthyroxine  To 75 mcg. This has already been sent to the pharmacy.  Your physician recommends that you schedule a follow-up appointment and echocardiogram in 6 months.

## 2013-10-27 ENCOUNTER — Telehealth: Payer: Self-pay | Admitting: Cardiovascular Disease

## 2013-10-27 NOTE — Telephone Encounter (Signed)
Patient returned RN's call.   Symptoms reports: 1. Numbness in left arm x1 week 2. Shoulder pain that is constant x1 week 3. Dull pain in chest 4. Symptoms are constant and are worse with rest  Patient denies SOB/lightheadedness/dizziness  He saw Dr. Tresa Endo 5/18 and denied CP/palpitations, did report fatigue Only medication change was increase in levothyroxine from 50 to QD

## 2013-10-27 NOTE — Telephone Encounter (Signed)
Having some numbness in his left arm and some sharp pains in the shoulder pain and kind of dull in the front on the left side .Marland Kitchen Please Call    Thanks

## 2013-10-27 NOTE — Telephone Encounter (Signed)
Returned call to patient. Advised him to see PCP or urgent care for eval. Patient did not seem happy about this advice, but was agreeable. He will contact office as needed.

## 2013-10-27 NOTE — Telephone Encounter (Signed)
Agree does not sound cardiac, would see PCP or urgent care, if they believe it is cardiac we would certainly be glad to see.

## 2013-10-27 NOTE — Telephone Encounter (Signed)
LMTCB

## 2013-10-28 ENCOUNTER — Other Ambulatory Visit (HOSPITAL_COMMUNITY): Payer: Self-pay | Admitting: Physician Assistant

## 2013-10-28 ENCOUNTER — Ambulatory Visit (HOSPITAL_COMMUNITY)
Admission: RE | Admit: 2013-10-28 | Discharge: 2013-10-28 | Disposition: A | Discharge status: Home / Self Care | Payer: 59 | Source: Ambulatory Visit | Attending: Physician Assistant | Admitting: Physician Assistant

## 2013-10-28 DIAGNOSIS — M899 Disorder of bone, unspecified: Secondary | ICD-10-CM

## 2013-10-28 DIAGNOSIS — R05 Cough: Secondary | ICD-10-CM | POA: Insufficient documentation

## 2013-10-28 DIAGNOSIS — R059 Cough, unspecified: Secondary | ICD-10-CM | POA: Insufficient documentation

## 2013-10-28 DIAGNOSIS — M549 Dorsalgia, unspecified: Secondary | ICD-10-CM | POA: Insufficient documentation

## 2013-10-28 DIAGNOSIS — M949 Disorder of cartilage, unspecified: Secondary | ICD-10-CM

## 2013-10-28 DIAGNOSIS — IMO0002 Reserved for concepts with insufficient information to code with codable children: Secondary | ICD-10-CM

## 2013-11-07 ENCOUNTER — Other Ambulatory Visit: Payer: Self-pay | Admitting: *Deleted

## 2013-11-07 MED ORDER — METOPROLOL SUCCINATE ER 50 MG PO TB24: 50.0000 mg | ORAL_TABLET | Freq: Every day | ORAL | Status: DC

## 2013-11-07 NOTE — Telephone Encounter (Signed)
Rx was sent to pharmacy electronically. 

## 2013-11-14 LAB — TSH: TSH: 6.283 u[IU]/mL — AB (ref 0.350–4.500)

## 2013-12-09 ENCOUNTER — Telehealth: Payer: Self-pay | Admitting: *Deleted

## 2013-12-09 MED ORDER — LEVOTHYROXINE SODIUM 88 MCG PO TABS: 88.0000 ug | ORAL_TABLET | Freq: Every day | ORAL | Status: DC

## 2013-12-09 NOTE — Telephone Encounter (Signed)
Lab results called to patient.  He is still taking of Synthroid and will increase to .  New Rx sent to pharmacy.  Patient voiced understanding.

## 2013-12-09 NOTE — Telephone Encounter (Signed)
Message copied by Vita BarleyLASSITER, Maximum Reiland A on Fri Dec 09, 2013 11:55 AM ------      Message from: Nicki GuadalajaraKELLY, THOMAS A      Created: Thu Dec 08, 2013  3:10 PM       Was this on synthroid 75 micrograms; if so then increase to 88 micrograms ------

## 2013-12-14 ENCOUNTER — Other Ambulatory Visit: Payer: Self-pay | Admitting: *Deleted

## 2013-12-14 MED ORDER — NIACIN ER (ANTIHYPERLIPIDEMIC) 1000 MG PO TBCR: 1000.0000 mg | EXTENDED_RELEASE_TABLET | Freq: Every day | ORAL | Status: DC

## 2013-12-27 ENCOUNTER — Ambulatory Visit (INDEPENDENT_AMBULATORY_CARE_PROVIDER_SITE_OTHER): Payer: 59 | Admitting: Urology

## 2013-12-27 ENCOUNTER — Ambulatory Visit (HOSPITAL_COMMUNITY)
Admission: RE | Admit: 2013-12-27 | Discharge: 2013-12-27 | Disposition: A | Discharge status: Home / Self Care | Payer: 59 | Source: Ambulatory Visit | Attending: Physical Medicine and Rehabilitation | Admitting: Physical Medicine and Rehabilitation

## 2013-12-27 DIAGNOSIS — I10 Essential (primary) hypertension: Secondary | ICD-10-CM | POA: Insufficient documentation

## 2013-12-27 DIAGNOSIS — N390 Urinary tract infection, site not specified: Secondary | ICD-10-CM

## 2013-12-27 DIAGNOSIS — M542 Cervicalgia: Secondary | ICD-10-CM | POA: Diagnosis not present

## 2013-12-27 DIAGNOSIS — E782 Mixed hyperlipidemia: Secondary | ICD-10-CM | POA: Insufficient documentation

## 2013-12-27 DIAGNOSIS — M25619 Stiffness of unspecified shoulder, not elsewhere classified: Secondary | ICD-10-CM | POA: Insufficient documentation

## 2013-12-27 DIAGNOSIS — IMO0001 Reserved for inherently not codable concepts without codable children: Secondary | ICD-10-CM | POA: Diagnosis present

## 2013-12-27 DIAGNOSIS — M2560 Stiffness of unspecified joint, not elsewhere classified: Secondary | ICD-10-CM | POA: Insufficient documentation

## 2013-12-27 DIAGNOSIS — R3129 Other microscopic hematuria: Secondary | ICD-10-CM

## 2013-12-27 DIAGNOSIS — E119 Type 2 diabetes mellitus without complications: Secondary | ICD-10-CM | POA: Insufficient documentation

## 2013-12-27 DIAGNOSIS — R972 Elevated prostate specific antigen [PSA]: Secondary | ICD-10-CM

## 2013-12-27 DIAGNOSIS — M539 Dorsopathy, unspecified: Secondary | ICD-10-CM | POA: Insufficient documentation

## 2013-12-27 NOTE — Evaluation (Addendum)
Physical Therapy Evaluation  Patient Details  Name: Jordan HissJohn L Goodwin MRN: [id number] Date of Birth: Jul 11, 1950  Today's Date: 12/27/2013 Time: 0815-0845 PT Time Calculation (min): 30 min      Charges 1 Evaluation, TherEx [phone]        Visit#: 1 of 9  Re-eval: 01/26/14 Assessment Diagnosis: Cervical Pain secondary to limited cervical spine stiffness.  Next MD Visit: 01/31/14 [name]  [facility]     Past Medical History:  Past Medical History  Diagnosis Date  . Hypertension   . Diabetes mellitus   . Mixed hyperlipidemia     low HDL  . 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 DVT (deep venous thrombosis) 08/2009    left lower extremity and had a DVT inthe left posterior tibial vein  . Murmur    Past Surgical History:  Past Surgical History  Procedure Laterality Date  . Tonsillectomy  1962  . Retinal detachment surgery      x's 2  . Cataract extraction, bilateral    . 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 Knee arthroscopy  2012    left knee/torn meniscus    Subjective Symptoms/Limitations Symptoms: Numbness and tingling in arms. Most difficulty with sitting.  Pertinent History: Patient awoke one day in hjune with neck and shoulder pain. 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  Limitations: Sitting How long can you sit comfortably?: 30 minutes.  Patient Stated Goals: To be able to sit for work.  Pain Assessment Currently in Pain?: Yes Pain Score: 3  Pain Location: Neck Pain Orientation: Right;Left;Medial;Lateral Pain Type: Chronic pain Pain Radiating Towards: bilateral UE , Rt UE > LT  Pain Onset: More than a month ago Pain Frequency: Constant Pain Relieving Factors: standing up and walking around. anti-inflammatories (Naproxen) Effect of Pain on Daily Activities: sitting  Cognition/Observation Observation/Other Assessments Observations: Sitting posture: forward head, forward  rounded shoudlers.   Assessment RUE AROM (degrees) RUE Overall AROM Comments: bilateral limitations Right Shoulder Flexion: 150 Degrees Right Shoulder ABduction: 140 Degrees Right Shoulder Horizontal ABduction: 10 Degrees Cervical AROM Overall Cervical AROM Comments: patient compensates for lack of cervical spine mobility with upper trapezius/levator elevatign scapula Cervical Flexion: 56 Cervical Extension: 50 Cervical - Right Side Bend: 30 Cervical - Left Side Bend: 26 Cervical - Right Rotation: 62 Cervical - Left Rotation: 64 Cervical Strength Cervical Flexion: 2+/5 Cervical Extension: 2+/5 Cervical - Right Side Bend: 2+/5 Cervical - Left Side Bend: 2+/5 Cervical - Right Rotation: 2+/5 Cervical - Left Rotation: 2+/5 Lumbar AROM Lumbar Flexion: WNL Lumbar Extension: 20% limited Lumbar - Right Side Bend: 30% limited Lumbar - Left Side Bend: 30% limited Lumbar - Right Rotation: 5% limited Lumbar - Left Rotation: 20% limited Palpation Palpation: Limited Rt doown glides of cervical spine, limited Lt < Rt up glides.   Exercise/Treatments Stretches Upper Trapezius Stretch: 5 reps;10 seconds Chest Stretch: Limitations Chest Stretch Limitations: 3 way pec stretch 10x 3sec Other Neck Stretches: Possterior shoulder stretch 103seconds Other Neck Stretches: Latissimus stretch 10x 3sec  Manual Therapy Manual Therapy: Joint mobilization Joint Mobilization: up and down glides grade 2 and 3 Lt and Rt throught cervical spine, down glides limited bilaterally and more limited on Rt  Physical Therapy Assessment and Plan PT Assessment and Plan Clinical Impression Statement: Patient displays bilateral Neck pain secondary to limited cervical and thoracic spine mobility and limited shoudler mobility. Specifically patient's neck  mobility appears limited by both joint mobility limitations throughotu the cervical spine and upper trapezius and Levator mobmuscle mobility limitations as well as  poor postureing as a result of liomited pectoral muscle mobility, decreased scapular stabilizer msuscle strength and decreased throacic extension strength. Patient will benefit from skilled physical therapy to improve shoulder, thoracic spine and cervical spine mobilioty and strength so patient can return to work and be able to sit >3 hours wihtout pain. Pt will benefit from skilled therapeutic intervention in order to improve on the following deficits: Decreased activity tolerance;Decreased strength;Impaired sensation;Impaired flexibility;Increased fascial restricitons;Decreased range of motion;Improper body mechanics;Pain Rehab Potential: Good Clinical Impairments Affecting Rehab Potential: No prior history of neck pain.  PT Frequency: Min 2X/week PT Duration: 4 weeks PT Treatment/Interventions: Therapeutic exercise;Modalities;Therapeutic activities;Patient/family education;Manual techniques;Other (comment) (Cervical traction) PT Plan: Initial focus on increasing mobility of cervical and throacic spine to decrease pain, as pain improves focus to shift towards strength and stability training. As mobility improves focus to shift to increasing thoracic and cervical spine stabilization.   Also Traction to be performed for pain relief as needed.   Goals Home Exercise Program Pt/caregiver will Perform Home Exercise Program: For increased ROM PT Goal: Perform Home Exercise Program - Progress: Goal set today PT Short Term Goals Time to Complete Short Term Goals: 2 weeks PT Short Term Goal 1: Patient will be able to side bend his head bilaterally to 40 degrees without pain or compensation by elevating shoulders, PT Short Term Goal 2: Patient will be able to sit > 1 hours wihtout pain >2/10 PT Short Term Goal 3: Patient will be able to rotate head bilaterally 80 degrees to look over shoulder while driving.  PT Short Term Goal 4: Patient will be able to flex and abduction bilateral UE to 160 degrees to be  able to reach overhead to  high shelf PT Short Term Goal 5: Patient will be able to horizontally abduct bilateral UE to >35 degrees indicatign improved pectoral mobility and decreased roundign forward of shoulder PT Long Term Goals Time to Complete Long Term Goals: 4 weeks PT Long Term Goal 1: Patient will be able to sit > 4 hours witotut pain to be able to toelrate normal work PT Long Term Goal 2: Patient will demosntrate a deep neck flexion endurance test score of >1 minute indicating improved cervical spine stability Long Term Goal 3: Patient will demosntrate a Lowr trapezius strength of 4/5 MMT to indicate improving posture.   Problem List Patient Active Problem List   Diagnosis Date Noted  . Stiffness of joints, not elsewhere classified, multiple sites 12/27/2013  . Cervicalgia 12/27/2013  . Hemoglobin C (Hb-C) 04/11/2013  . Hyperlipidemia LDL goal < 70 10/19/2012  . Hypothyroid 10/19/2012  . DM 11/30/2009  . UNSPECIFIED ANEMIA 11/30/2009  . HYPERTENSION 11/30/2009  . DVT 11/30/2009  . BRONCHIECTASIS 11/30/2009  . COMPARTMENT SYNDROME UNSPECIFIED 11/30/2009  . ARTHRITIS, LEFT KNEE 09/10/2009    PT - End of Session Activity Tolerance: Patient tolerated treatment well General Behavior During Therapy: WFL for tasks assessed/performed PT Plan of Care PT Home Exercise Plan: Upper trapezius, latissimus dorsi, 3way Pec and posterior shoulder stretches  GP    Tahnee Cifuentes R 12/27/2013, 12:45 PM  Physician Documentation Your signature is required to indicate approval of the treatment plan as stated above.  Please sign and either send electronically or make a copy of this report for your files and return this physician signed original.   Please mark one 1.__approve of  plan  2. ___approve of plan with the following conditions.   ______________________________                                                          _____________________ Physician Signature                                                                                                              Date

## 2013-12-29 ENCOUNTER — Ambulatory Visit (HOSPITAL_COMMUNITY)
Admission: RE | Admit: 2013-12-29 | Discharge: 2013-12-29 | Disposition: A | Discharge status: Home / Self Care | Payer: 59 | Source: Ambulatory Visit | Attending: Physical Medicine and Rehabilitation | Admitting: Physical Medicine and Rehabilitation

## 2013-12-29 DIAGNOSIS — IMO0001 Reserved for inherently not codable concepts without codable children: Secondary | ICD-10-CM | POA: Diagnosis not present

## 2013-12-29 NOTE — Progress Notes (Signed)
Physical Therapy Treatment Patient Details  Name: Jordan Goodwin MRN: [id number] Date of Birth: 1950/12/19  Today's Date: 12/29/2013 Time: [id number] PT Time Calculation (min): 38 min Charge TE [id number]  Visit#: 2 of 9  Re-eval: 01/26/14 Assessment Diagnosis: Cervical Pain secondary to limited cervical spine stiffness.  Next MD Visit: 01/31/14 [name]  Authorization: [employer]  Authorization Time Period:    Authorization Visit#:   of     Subjective: Symptoms/Limitations Symptoms: Pt stated compliance with HEP, pain minimal today and no reports of tingling of numbmess Pain Assessment Currently in Pain?: Yes Pain Score: 2  Pain Location: Neck Pain Orientation: Left  Objective:   Exercise/Treatments Stretches Upper Trapezius Stretch: 3 reps;30 seconds Levator Stretch: 2 reps;20 seconds Chest Stretch: Limitations Chest Stretch Limitations: 3 way pec stretch 10x 3sec Other Neck Stretches: Anterior and Possterior shoulder stretch 103seconds Other Neck Stretches: Latissimus stretch 10x 3sec Standing Exercises Other Standing Exercises: 3D neck excursion 10x in // bars  Seated Exercises Other Seated Exercise: 3D thoracic excursion 10x  Other Seated Exercise: Manual assistance with thoracic extension    Physical Therapy Assessment and Plan PT Assessment and Plan Clinical Impression Statement: Began PT POC focus on improving cervical and thoracic  mobilty wtih 3D excursion exercises and stretches instructed.  Pt able to perform all HEP and new stretches with good form following min cueing initially.  Pt educated on importance of good posture to reduce stress  of neck musculature to reduce forward flexed head.  Pt stated pain free at end of sessoin. PT Plan: Initial focus on increasing mobility of cervical and throacic spine to decrease pain, as pain improves focus to shift towards strength and stability training. As mobility improves focus to shift to increasing thoracic and cervical  spine stabilization  Next session begin cervical retraction and educated on proper sitting and standing posture.    Goals PT Short Term Goals PT Short Term Goal 1: Patient will be able to side bend his head bilaterally to 40 degrees without pain or compensation by elevating shoulders, PT Short Term Goal 1 - Progress: Progressing toward goal PT Short Term Goal 2: Patient will be able to sit > 1 hours wihtout pain >2/10 PT Short Term Goal 2 - Progress: Progressing toward goal PT Short Term Goal 3: Patient will be able to rotate head bilaterally 80 degrees to look over shoulder while driving.  PT Short Term Goal 3 - Progress: Progressing toward goal PT Short Term Goal 4: Patient will be able to flex and abduction bilateral UE to 160 degrees to be able to reach overhead to  high shelf PT Short Term Goal 4 - Progress: Progressing toward goal PT Short Term Goal 5: Patient will be able to horizontally abduct bilateral UE to >35 degrees indicatign improved pectoral mobility and decreased roundign forward of shoulder PT Short Term Goal 5 - Progress: Progressing toward goal PT Long Term Goals PT Long Term Goal 1: Patient will be able to sit > 4 hours witotut pain to be able to toelrate normal work PT Long Term Goal 2: Patient will demosntrate a deep neck flexion endurance test score of >1 minute indicating improved cervical spine stability Long Term Goal 3: Patient will demosntrate a Lowr trapezius strength of 4/5 MMT to indicate improving posture.   Problem List Patient Active Problem List   Diagnosis Date Noted  . Stiffness of joints, not elsewhere classified, multiple sites 12/27/2013  . Cervicalgia 12/27/2013  . Hemoglobin C (Hb-C) 04/11/2013  . Hyperlipidemia LDL  goal < 70 10/19/2012  . Hypothyroid 10/19/2012  . DM 11/30/2009  . UNSPECIFIED ANEMIA 11/30/2009  . HYPERTENSION 11/30/2009  . DVT 11/30/2009  . BRONCHIECTASIS 11/30/2009  . COMPARTMENT SYNDROME UNSPECIFIED 11/30/2009  .  ARTHRITIS, LEFT KNEE 09/10/2009    PT - End of Session Activity Tolerance: Patient tolerated treatment well General Behavior During Therapy: [facility] for tasks assessed/performed  GP    Juel Burrow 12/29/2013, 9:30 AM

## 2014-01-04 ENCOUNTER — Ambulatory Visit (HOSPITAL_COMMUNITY)
Admission: RE | Admit: 2014-01-04 | Discharge: 2014-01-04 | Disposition: A | Discharge status: Home / Self Care | Payer: 59 | Source: Ambulatory Visit | Attending: Physical Medicine and Rehabilitation | Admitting: Physical Medicine and Rehabilitation

## 2014-01-04 DIAGNOSIS — M25619 Stiffness of unspecified shoulder, not elsewhere classified: Secondary | ICD-10-CM | POA: Diagnosis not present

## 2014-01-04 DIAGNOSIS — E119 Type 2 diabetes mellitus without complications: Secondary | ICD-10-CM | POA: Insufficient documentation

## 2014-01-04 DIAGNOSIS — E782 Mixed hyperlipidemia: Secondary | ICD-10-CM | POA: Diagnosis not present

## 2014-01-04 DIAGNOSIS — IMO0001 Reserved for inherently not codable concepts without codable children: Secondary | ICD-10-CM | POA: Diagnosis not present

## 2014-01-04 DIAGNOSIS — M542 Cervicalgia: Secondary | ICD-10-CM | POA: Diagnosis not present

## 2014-01-04 DIAGNOSIS — I10 Essential (primary) hypertension: Secondary | ICD-10-CM | POA: Diagnosis not present

## 2014-01-04 DIAGNOSIS — M539 Dorsopathy, unspecified: Secondary | ICD-10-CM | POA: Diagnosis not present

## 2014-01-04 NOTE — Progress Notes (Signed)
Physical Therapy Treatment Patient Details  Name: Dorise HissJohn L Longton MRN: [id number] Date of Birth: 1951/05/06  Today's Date: 01/04/2014 Time: [id number] PT Time Calculation (min): 43 min TE [id number]  Visit#: 3 of 9  Re-eval: 01/26/14 Assessment Diagnosis: Cervical Pain secondary to limited cervical spine stiffness.  Next MD Visit: 01/31/14 [name]   Subjective: Symptoms/Limitations Symptoms: Pt reports he was working on his computer this morning, and has mild pain in the Rt shoulder cervical spine/shoulder from activity.   Pain Assessment Currently in Pain?: Yes Pain Score: 3  Pain Location: Back Pain Orientation: Mid;Right   Exercise/Treatments Stretches Upper Trapezius Stretch: 3 reps;30 seconds Levator Stretch: 3 reps;30 seconds Chest Stretch Limitations: 3 way Pec Stretch 10" x3 each Machines for Strengthening UBE (Upper Arm Bike): Backward, Resistance 3, 4' (with 1 rest break) Theraband Exercises Rows: Blue;20 reps Seated Exercises Neck Retraction: 10 reps;5 secs Other Seated Exercise: 3D thoracic excursion 10x  Supine Exercises Other Supine Exercise: Latissimus Slides with wand x10  Physical Therapy Assessment and Plan PT Assessment and Plan Clinical Impression Statement: Initiated chin tuck exercise for posture, with VC and TC for cervical retraction vs. cervical extension to complete exercise.  Noted pt compensation of shoulder elevation during stretching and strenghtening exercises requiring VC and TC for appropriate technique.  Educated pt on importance of posture during functional activities, particularly when on computer as pt works on the computer.; instructed pt in appropriate posture, rest breaks, and exercises to complete sitting in computer chair.  Pt will benefit from skilled therapeutic intervention in order to improve on the following deficits: Decreased activity tolerance;Decreased strength;Impaired sensation;Impaired flexibility;Increased fascial  restricitons;Decreased range of motion;Improper body mechanics;Pain Rehab Potential: Good PT Duration: 4 weeks PT Treatment/Interventions: Therapeutic exercise;Modalities;Therapeutic activities;Patient/family education;Manual techniques;Other (comment) PT Plan: Progress strengthening as tolerated, with cervical and scapular strengthening.  Update HEP as needed.      Problem List Patient Active Problem List   Diagnosis Date Noted  . Stiffness of joints, not elsewhere classified, multiple sites 12/27/2013  . Cervicalgia 12/27/2013  . Hemoglobin C (Hb-C) 04/11/2013  . Hyperlipidemia LDL goal < 70 10/19/2012  . Hypothyroid 10/19/2012  . DM 11/30/2009  . UNSPECIFIED ANEMIA 11/30/2009  . HYPERTENSION 11/30/2009  . DVT 11/30/2009  . BRONCHIECTASIS 11/30/2009  . COMPARTMENT SYNDROME UNSPECIFIED 11/30/2009  . ARTHRITIS, LEFT KNEE 09/10/2009    PT - End of Session Activity Tolerance: Patient tolerated treatment well General Behavior During Therapy: [facility] assessed/per[facility]   Aneeka Bowden 01/04/2014, 1:50 PM

## 2014-01-11 ENCOUNTER — Ambulatory Visit (HOSPITAL_COMMUNITY)
Admission: RE | Admit: 2014-01-11 | Discharge: 2014-01-11 | Disposition: A | Discharge status: Home / Self Care | Payer: 59 | Source: Ambulatory Visit | Attending: Physical Medicine and Rehabilitation | Admitting: Physical Medicine and Rehabilitation

## 2014-01-11 DIAGNOSIS — IMO0001 Reserved for inherently not codable concepts without codable children: Secondary | ICD-10-CM | POA: Diagnosis not present

## 2014-01-11 NOTE — Progress Notes (Signed)
Physical Therapy Treatment Patient Details  Name: Jordan Goodwin MRN: [id number] Date of Birth: [birth year]  Today's Date: 01/11/2014 Time: 0802-0840 PT Time Calculation (min): 38 min Visit#: 4 of 9  Re-eval: 01/26/14 Charges:  therex 802-826 (24'), manual 826-840 (14')    Subjective: Symptoms/Limitations Symptoms: PT states he continues to have some mild pain and into the Rt shoulder region. Pain Assessment Currently in Pain?: Yes Pain Score: 3    Exercise/Treatments Stretches Chest Stretch: Limitations Chest Stretch Limitations: 3 way Pec Stretch 10" x3 each Machines for Strengthening UBE (Upper Arm Bike): Backward, Resistance 3, 4' (with 1 rest break) Theraband Exercises Rows: Blue;20 reps Seated Exercises Neck Retraction: 10 reps;5 secs Other Seated Exercise: 3D thoracic excursion 10x  Prone Exercises Neck Retraction: 10 reps W Back: 10 reps Shoulder Extension: 10 reps Rows: 10 reps Upper Extremity Flexion with Stabilization: 10 reps;Limitations UE Flexion with Stabilization Limitations: alternating   Manual Therapy Manual Therapy: Other (comment) Other Manual Therapy: STM/myofascial techniques to bilateral traps and cervical musculature  Physical Therapy Assessment and Plan PT Assessment and Plan Clinical Impression Statement: Pt continues to have difficulty with cervical retraction, tending to move shoulders.  Pt requires therapist facilitation with all therex to perform in correct form and maintain posture.  Added prone stab exercises .  Noted tightness in bilateral traps with large spasm in Lt lower trap/upper lats region.  PT Plan: Continue to progress strength and postural improvments.     Problem List Patient Active Problem List   Diagnosis Date Noted  . Stiffness of joints, not elsewhere classified, multiple sites 12/27/2013  . Cervicalgia 12/27/2013  . Hemoglobin C (Hb-C) 04/11/2013  . Hyperlipidemia LDL goal < 70 10/19/2012  . Hypothyroid  10/19/2012  . DM 11/30/2009  . UNSPECIFIED ANEMIA 11/30/2009  . HYPERTENSION 11/30/2009  . DVT 11/30/2009  . BRONCHIECTASIS 11/30/2009  . COMPARTMENT SYNDROME UNSPECIFIED 11/30/2009  . ARTHRITIS, LEFT KNEE 09/10/2009    PT - End of Session Activity Tolerance: Patient tolerated treatment well General Behavior During Therapy: [facility] for tasks assessed/performed      Lurena NidaAmy B Mouhamed Glassco, PTA/CLT 01/11/2014, 8:48 AM

## 2014-01-13 ENCOUNTER — Inpatient Hospital Stay (HOSPITAL_COMMUNITY): Admission: RE | Admit: 2014-01-13 | Payer: 59 | Source: Ambulatory Visit | Admitting: Physical Therapy

## 2014-01-17 ENCOUNTER — Ambulatory Visit (HOSPITAL_COMMUNITY)
Admission: RE | Admit: 2014-01-17 | Discharge: 2014-01-17 | Disposition: A | Discharge status: Home / Self Care | Payer: 59 | Source: Ambulatory Visit | Attending: Physical Medicine and Rehabilitation | Admitting: Physical Medicine and Rehabilitation

## 2014-01-17 DIAGNOSIS — IMO0001 Reserved for inherently not codable concepts without codable children: Secondary | ICD-10-CM | POA: Diagnosis not present

## 2014-01-17 NOTE — Progress Notes (Signed)
Physical Therapy Treatment Patient Details  Name: Jordan Goodwin MRN: [id number] Date of Birth: [birth year]  Today's Date: 01/17/2014 Time: 0800-0840 PT Time Calculation (min): 40 min  Visit#: 5 of 9  Re-eval: 01/26/14 Charges:  therex [phone] (28'), manual 828-840 (12')  Subjective: Symptoms/Limitations Symptoms: Pt states he is better today.  Reports the manual really helped to decrease his pain/spasms last visit. Pain Assessment Currently in Pain?: Yes Pain Score: 2    Exercise/Treatments Stretches Chest Stretch: Limitations Chest Stretch Limitations: 3 way Pec Stretch 10" x3 each Machines for Strengthening UBE (Upper Arm Bike): Backward, Resistance 3, 4' (with 1 rest break) Theraband Exercises Rows: Blue;20 reps Seated Exercises Neck Retraction: 10 reps;5 secs Other Seated Exercise: 3D thoracic excursion 10x  Prone Exercises Neck Retraction: 10 reps W Back: 10 reps Shoulder Extension: 10 reps Rows: 10 reps Upper Extremity Flexion with Stabilization: 10 reps;Limitations UE Flexion with Stabilization Limitations: alternating   Manual Therapy Manual Therapy: Other (comment) Other Manual Therapy: STM/myofascial techniques to bilateral traps and cervical musculature  Physical Therapy Assessment and Plan PT Assessment and Plan Clinical Impression Statement: Pt required less cues today to perform exercises correctly.  Improving posture with decreased spasm/tightness noted with manual techniques.   PT Plan: Continue to progress strength and posture.     Problem List Patient Active Problem List   Diagnosis Date Noted  . Stiffness of joints, not elsewhere classified, multiple sites 12/27/2013  . Cervicalgia 12/27/2013  . Hemoglobin C (Hb-C) 04/11/2013  . Hyperlipidemia LDL goal < 70 10/19/2012  . Hypothyroid 10/19/2012  . DM 11/30/2009  . UNSPECIFIED ANEMIA 11/30/2009  . HYPERTENSION 11/30/2009  . DVT 11/30/2009  . BRONCHIECTASIS 11/30/2009  . COMPARTMENT  SYNDROME UNSPECIFIED 11/30/2009  . ARTHRITIS, LEFT KNEE 09/10/2009    PT - End of Session Activity Tolerance: Patient tolerated treatment well General Behavior During Therapy: [facility] assessed/performed  GP    Lurena NidaAmy B Gabriel Conry, PTA/CLT 01/17/2014, 9:07 AM

## 2014-01-19 ENCOUNTER — Ambulatory Visit (HOSPITAL_COMMUNITY)
Admission: RE | Admit: 2014-01-19 | Discharge: 2014-01-19 | Disposition: A | Discharge status: Home / Self Care | Payer: 59 | Source: Ambulatory Visit | Attending: Physical Medicine and Rehabilitation | Admitting: Physical Medicine and Rehabilitation

## 2014-01-19 DIAGNOSIS — IMO0001 Reserved for inherently not codable concepts without codable children: Secondary | ICD-10-CM | POA: Diagnosis not present

## 2014-01-19 NOTE — Progress Notes (Signed)
Physical Therapy Treatment Patient Details  Name: Jordan HissJohn L Verhoeven MRN: [id number] Date of Birth: 03-20-1951  Today's Date: 01/19/2014 Time: 0800-0845 PT Time Calculation (min): 45 min Charge: TE [id number], Manual [id number]  Visit#: 6 of 9  Re-eval: 01/26/14 Assessment Diagnosis: Cervical Pain secondary to limited cervical spine stiffness.  Next MD Visit: 01/31/14 [name]  Authorization: [employer]  Authorization Time Period:    Authorization Visit#:   of     Subjective: Symptoms/Limitations Symptoms: Pt stated he is feeling better today, pain minimum.  Pt stated he believes his pain has moved from neck to infront of Lt shoulder blade. Pain Assessment Currently in Pain?: Yes Pain Score: 1  Pain Location: Scapula Pain Orientation: Anterior;Left  Objective:   Exercise/Treatments Stretches Chest Stretch: Limitations Chest Stretch Limitations: 3 way Pec Stretch 10" x3 each Other Neck Stretches: Possterior shoulder stretch 103seconds Machines for Strengthening UBE (Upper Arm Bike): Backward, Resistance 3, 5' no rest breaks Theraband Exercises Scapula Retraction: 10 reps;Blue Shoulder Extension: 20 reps;10 reps;Green Rows: 20 reps;Green Seated Exercises Other Seated Exercise: 3D thoracic excursion 10x  Prone Exercises Neck Retraction: 10 reps W Back: 10 reps Shoulder Extension: 10 reps Rows: 10 reps  Manual Therapy Manual Therapy: Massage Massage: MFR/STM to Bil traps, Lt scapular region focus on teres major, infraspinalis and subscapularis  Physical Therapy Assessment and Plan PT Assessment and Plan Clinical Impression Statement: Postural strengthening progress and improved activity tolerance.  Pt able to perform theraband activities with minimal cueing, pt given green theraband and HEP worksheet to add to HEP.  No rest breaks required with UBE today.  Manual focus on Lt scapular mobilty and Bil trapezius, decreased  spasms noted with teres major, infraspinalis and  subscapularis for pain control.   PT Plan: Continue to progress strength and posture.    Goals PT Short Term Goals PT Short Term Goal 1: Patient will be able to side bend his head bilaterally to 40 degrees without pain or compensation by elevating shoulders, PT Short Term Goal 1 - Progress: Progressing toward goal PT Short Term Goal 2: Patient will be able to sit > 1 hours wihtout pain >2/10 PT Short Term Goal 2 - Progress: Progressing toward goal PT Short Term Goal 3: Patient will be able to rotate head bilaterally 80 degrees to look over shoulder while driving.  PT Short Term Goal 3 - Progress: Progressing toward goal PT Short Term Goal 4: Patient will be able to flex and abduction bilateral UE to 160 degrees to be able to reach overhead to  high shelf PT Short Term Goal 4 - Progress: Progressing toward goal PT Short Term Goal 5: Patient will be able to horizontally abduct bilateral UE to >35 degrees indicatign improved pectoral mobility and decreased roundign forward of shoulder PT Short Term Goal 5 - Progress: Progressing toward goal PT Long Term Goals PT Long Term Goal 1: Patient will be able to sit > 4 hours witotut pain to be able to toelrate normal work PT Long Term Goal 2: Patient will demosntrate a deep neck flexion endurance test score of >1 minute indicating improved cervical spine stability PT Long Term Goal 2 - Progress: Progressing toward goal Long Term Goal 3: Patient will demosntrate a Lowr trapezius strength of 4/5 MMT to indicate improving posture.   Problem List Patient Active Problem List   Diagnosis Date Noted  . Stiffness of joints, not elsewhere classified, multiple sites 12/27/2013  . Cervicalgia 12/27/2013  . Hemoglobin C (Hb-C) 04/11/2013  . Hyperlipidemia LDL goal <  70 10/19/2012  . Hypothyroid 10/19/2012  . DM 11/30/2009  . UNSPECIFIED ANEMIA 11/30/2009  . HYPERTENSION 11/30/2009  . DVT 11/30/2009  . BRONCHIECTASIS 11/30/2009  . COMPARTMENT SYNDROME  UNSPECIFIED 11/30/2009  . ARTHRITIS, LEFT KNEE 09/10/2009    PT - End of Session Activity Tolerance: Patient tolerated treatment well General Behavior During Therapy: [facility] for tasks assessed/performed  GP    Juel Burrow 01/19/2014, 8:56 AM

## 2014-01-24 ENCOUNTER — Ambulatory Visit (HOSPITAL_COMMUNITY)
Admission: RE | Admit: 2014-01-24 | Discharge: 2014-01-24 | Disposition: A | Discharge status: Home / Self Care | Payer: 59 | Source: Ambulatory Visit | Attending: Physical Medicine and Rehabilitation | Admitting: Physical Medicine and Rehabilitation

## 2014-01-24 DIAGNOSIS — IMO0001 Reserved for inherently not codable concepts without codable children: Secondary | ICD-10-CM | POA: Diagnosis not present

## 2014-01-24 NOTE — Progress Notes (Addendum)
Physical Therapy Treatment Patient Details  Name: Jordan Goodwin MRN: [id number] Date of Birth: 21-Mar-1951  Today's Date: 01/24/2014 Time: 0800-0845 PT Time Calculation (min): 45 min   Charges: Manual [phone], TherEx [phone] Visit#: 7 of 9  Re-eval: 01/26/14 Assessment Diagnosis: Cervical Pain secondary to limited cervical spine stiffness.  Next MD Visit: 01/31/14 [name]  Authorization: [employer]   Subjective: Symptoms/Limitations Symptoms: Patient notes conitnued though minimal pain just infeior medial to Left scapula.  Pain Assessment Currently in Pain?: Yes Pain Location: Scapula Pain Type: Chronic pain  Exercise/Treatments Stretches Chest Stretch: Limitations Chest Stretch Limitations: 3 way Pec Stretch 10" x3 each Other Neck Stretches: Latissimus stretch 10x 3sec Theraband Exercises Rows: 20 reps;Green Standing Exercises Other Standing Exercises: 3D dowel pendulems 10x Other Standing Exercises: scapular push ups 20x at raised table  2way pick up and reach wtih 3lb dumbbell 10x  Manual Therapy Massage: MFR/STM to Bil traps, Lt scapular region focus on teres major, infraspinalis and subscapularis and Pectoralis major/minor  Physical Therapy Assessment and Plan PT Assessment and Plan Clinical Impression Statement: Manual focused to decrease pectoral adhesions and increase pectoral mobility to allow for improved posture and improved scapulo-humeral rhythm with overhead reaching. Patient requires tactile and verbal cues for correct performance of pick up and reach, rows and scapular push-up. Patient displays overall improving mobility though still limited scapulo-humeral rhythm on lt resulting in minor pain in rhomboid muscls attributed to limited terratus anterior strength, pectorl mopbility limitation, and scapular stabilizer weakness PT Plan: Continue to progress strength and posture. Reassessment next session.     Goals PT Short Term Goals PT Short Term Goal 1: Patient will  be able to side bend his head bilaterally to 40 degrees without pain or compensation by elevating shoulders, PT Short Term Goal 1 - Progress: Progressing toward goal PT Short Term Goal 2: Patient will be able to sit > 1 hours wihtout pain >2/10 PT Short Term Goal 2 - Progress: Progressing toward goal PT Short Term Goal 3: Patient will be able to rotate head bilaterally 80 degrees to look over shoulder while driving.  PT Short Term Goal 3 - Progress: Progressing toward goal PT Short Term Goal 4: Patient will be able to flex and abduction bilateral UE to 160 degrees to be able to reach overhead to  high shelf PT Short Term Goal 4 - Progress: Progressing toward goal PT Short Term Goal 5: Patient will be able to horizontally abduct bilateral UE to >35 degrees indicatign improved pectoral mobility and decreased roundign forward of shoulder PT Short Term Goal 5 - Progress: Progressing toward goal  Problem List Patient Active Problem List   Diagnosis Date Noted  . Stiffness of joints, not elsewhere classified, multiple sites 12/27/2013  . Cervicalgia 12/27/2013  . Hemoglobin C (Hb-C) 04/11/2013  . Hyperlipidemia LDL goal < 70 10/19/2012  . Hypothyroid 10/19/2012  . DM 11/30/2009  . UNSPECIFIED ANEMIA 11/30/2009  . HYPERTENSION 11/30/2009  . DVT 11/30/2009  . BRONCHIECTASIS 11/30/2009  . COMPARTMENT SYNDROME UNSPECIFIED 11/30/2009  . ARTHRITIS, LEFT KNEE 09/10/2009   GP    Louvenia Golomb R 01/24/2014, 8:48 AM

## 2014-01-26 ENCOUNTER — Ambulatory Visit (HOSPITAL_COMMUNITY)
Admission: RE | Admit: 2014-01-26 | Discharge: 2014-01-26 | Disposition: A | Discharge status: Home / Self Care | Payer: 59 | Source: Ambulatory Visit | Attending: Physical Medicine and Rehabilitation | Admitting: Physical Medicine and Rehabilitation

## 2014-01-26 DIAGNOSIS — IMO0001 Reserved for inherently not codable concepts without codable children: Secondary | ICD-10-CM | POA: Diagnosis not present

## 2014-01-26 NOTE — Evaluation (Addendum)
Physical Therapy reassessment  Patient Details  Name: Jordan Goodwin MRN: [id number] Date of Birth: 03/06/1951  Today's Date: 01/26/2014 Time: 0801-0845 PT Time Calculation (min): 44 min    Charges: manual therapy [phone], TherEx [phone]          Visit#: 8 of 17  Re-eval: 01/26/14   Authorization: [facility]     Past Medical History:  Past Medical History  Diagnosis Date  . Hypertension   . Diabetes mellitus   . Mixed hyperlipidemia     low HDL  . 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 DVT (deep venous thrombosis) 08/2009    left lower extremity and had a DVT inthe left posterior tibial vein  . Murmur    Past Surgical History:  Past Surgical History  Procedure Laterality Date  . Tonsillectomy  1962  . Retinal detachment surgery      x's 2  . Cataract extraction, bilateral    . 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 Knee arthroscopy  2012    left knee/torn meniscus    Subjective Symptoms/Limitations Symptoms: I am feeling better i only have enough pain to know something is there Pain Assessment Currently in Pain?: Yes Pain Score: 1  Pain Location: Scapula Pain Orientation: Anterior;Left Pain Type: Chronic pain  Cognition/Observation Observation/Other Assessments Observations: Sitting posture: forward head, forward rounded shoudlers.   Sensation/Coordination/Flexibility/Functional Tests Functional Tests Functional Tests: FOTO:23% limited was 42% limited  Assessment RUE AROM (degrees) Right Shoulder Flexion: 160 Degrees Right Shoulder ABduction: 152 Degrees Right Shoulder Horizontal ABduction: 10 Degrees Cervical AROM Cervical Flexion: 56 Cervical Extension: 50 Cervical - Right Side Bend: 35 Cervical - Left Side Bend: 38 Cervical - Right Rotation: 67 Cervical - Left Rotation: 68 Cervical Strength Cervical Flexion: 4/5 Cervical Extension: 4/5 Cervical - Right Side Bend: 4/5 Cervical - Left Side Bend:  4/5 Cervical - Right Rotation: 4/5 Cervical - Left Rotation: 4/5 Lumbar AROM Lumbar Flexion: WNL Lumbar Extension: WNL Lumbar - Right Side Bend: WNL Lumbar - Left Side Bend: WNL Lumbar - Right Rotation: WNL Lumbar - Left Rotation: WNL  Exercise/Treatments Theraband Exercises Rows: 20 reps;Blue Standing Exercises Other Standing Exercises: scapular push ups 20x at raised table 3 way Pectoral stretch 10x 3second hold  Manual therapy: Pectoral release, and subscapularis release. Soft tissue mobilization of rhomboid and mid/low Trapezius  Physical Therapy Assessment and Plan PT Assessment and Plan Clinical Impression Statement: Patient is making great progress towards all goals. patient conitnues to have minor mobility limitationg secondary to minor postural restraints. Recommending conitnued phsyical therapy 2x a week for 4 more weeks to reach all goals and for work hardening to prevent future injury/pain.  PT Frequency: Min 2X/week PT Duration: 4 weeks PT Plan: Continue to progress strength and posture.Focus of manual for pain relief and increasing pectoral mobility. strengheing of serratus anterior, and scapular stabilizers.     Goals PT Short Term Goals PT Short Term Goal 1: Patient will be able to side bend his head bilaterally to 40 degrees without pain or compensation by elevating shoulders, PT Short Term Goal 1 - Progress: Progressing toward goal PT Short Term Goal 2: Patient will be able to sit > 1 hours wihtout pain >2/10 PT Short Term Goal 2 - Progress: Met PT Short Term Goal 3: Patient will be able to rotate head bilaterally 80 degrees to look over shoulder while driving.  PT Short Term Goal 3 - Progress:  Progressing toward goal PT Short Term Goal 4: Patient will be able to flex and abduction bilateral UE to 160 degrees to be able to reach overhead to  high shelf PT Short Term Goal 4 - Progress: Met PT Short Term Goal 5: Patient will be able to horizontally abduct  bilateral UE to >35 degrees indicatign improved pectoral mobility and decreased roundign forward of shoulder PT Short Term Goal 5 - Progress: Progressing toward goal PT Long Term Goals PT Long Term Goal 1: Patient will be able to sit > 4 hours witotut pain to be able to toelrate normal work PT Long Term Goal 1 - Progress: Met PT Long Term Goal 2: Patient will demosntrate a deep neck flexion endurance test score of >1 minute indicating improved cervical spine stability PT Long Term Goal 2 - Progress: Progressing toward goal Long Term Goal 3: Patient will demosntrate a Lowr trapezius strength of 4/5 MMT to indicate improving posture.  Long Term Goal 3 Progress: Progressing toward goal  Problem List Patient Active Problem List   Diagnosis Date Noted  . Stiffness of joints, not elsewhere classified, multiple sites 12/27/2013  . Cervicalgia 12/27/2013  . Hemoglobin C (Hb-C) 04/11/2013  . Hyperlipidemia LDL goal < 70 10/19/2012  . Hypothyroid 10/19/2012  . DM 11/30/2009  . UNSPECIFIED ANEMIA 11/30/2009  . HYPERTENSION 11/30/2009  . DVT 11/30/2009  . BRONCHIECTASIS 11/30/2009  . COMPARTMENT SYNDROME UNSPECIFIED 11/30/2009  . ARTHRITIS, LEFT KNEE 09/10/2009    PT - End of Session Activity Tolerance: Patient tolerated treatment well General Behavior During Therapy: [facility] for tasks assessed/performed  GP    Nohea Kras R 01/26/2014, 8:46 AM  Physician Documentation Your signature is required to indicate approval of the treatment plan as stated above.  Please sign and either send electronically or make a copy of this report for your files and return this physician signed original.   Please mark one 1.__approve of plan  2. ___approve of plan with the following conditions.   ______________________________                                                          _____________________ Physician Signature                                                                                                              Date

## 2014-01-31 ENCOUNTER — Ambulatory Visit (HOSPITAL_COMMUNITY)
Admission: RE | Admit: 2014-01-31 | Discharge: 2014-01-31 | Disposition: A | Discharge status: Home / Self Care | Payer: 59 | Source: Ambulatory Visit | Attending: Family Medicine | Admitting: Family Medicine

## 2014-01-31 DIAGNOSIS — M25619 Stiffness of unspecified shoulder, not elsewhere classified: Secondary | ICD-10-CM | POA: Insufficient documentation

## 2014-01-31 DIAGNOSIS — M539 Dorsopathy, unspecified: Secondary | ICD-10-CM | POA: Diagnosis not present

## 2014-01-31 DIAGNOSIS — M542 Cervicalgia: Secondary | ICD-10-CM | POA: Diagnosis not present

## 2014-01-31 DIAGNOSIS — I10 Essential (primary) hypertension: Secondary | ICD-10-CM | POA: Diagnosis not present

## 2014-01-31 DIAGNOSIS — E782 Mixed hyperlipidemia: Secondary | ICD-10-CM | POA: Insufficient documentation

## 2014-01-31 DIAGNOSIS — IMO0001 Reserved for inherently not codable concepts without codable children: Secondary | ICD-10-CM | POA: Insufficient documentation

## 2014-01-31 DIAGNOSIS — E119 Type 2 diabetes mellitus without complications: Secondary | ICD-10-CM | POA: Insufficient documentation

## 2014-01-31 NOTE — Progress Notes (Signed)
Physical Therapy Treatment Patient Details  Name: Jordan Goodwin MRN: [id number] Date of Birth: 14-Oct-1950  Today's Date: 01/31/2014 Time: 0802-0845 PT Time Calculation (min): 43 min Visit#: 9 of 17  Re-eval: 01/26/14 Authorization: [employer]  Charges:  therex [phone] (26'), manual 830-844 (14')   Subjective: Symptoms/Limitations Symptoms: Pt states he really doesnt have any pain, just with certain movements he knows it's there. Pain Assessment Currently in Pain?: No/denies   Exercise/Treatments Stretches Corner Stretch: 3 reps;20 seconds Chest Stretch: Limitations Chest Stretch Limitations: 3 way Pec Stretch 10" x3 each Machines for Strengthening UBE (Upper Arm Bike): Backward, Resistance 3, 5' no rest breaks Standing Exercises Other Standing Exercises: wall push ups 10 reps Other Standing Exercises: scapular push ups 20x at raised table Prone Exercises Neck Retraction: 10 reps W Back: 10 reps Shoulder Extension: 10 reps Rows: 10 reps Upper Extremity Flexion with Stabilization: 10 reps;Limitations UE Flexion with Stabilization Limitations: alternating     Manual Therapy Manual Therapy: Massage Massage: MFR/STM to Bil traps and scapular region, LT>Rt in prone position   Physical Therapy Assessment and Plan PT Assessment and Plan Clinical Impression Statement:  Resumed prone postural strengthening exercises and insttructed in corner stretch for pectoral muscles.  Continued  Postural education.  Tightness in Lt subscapular region today and spasm in Lt rhomboid.  No other tissue dysfunction noted.   PT Plan: Continue to progress strength of serratus anterior and scapular stabilizers.  Plan with work hardening focus per evaluating PT to resume work without pain and further injury.      Problem List Patient Active Problem List   Diagnosis Date Noted  . Stiffness of joints, not elsewhere classified, multiple sites 12/27/2013  . Cervicalgia 12/27/2013  . Hemoglobin C (Hb-C)  04/11/2013  . Hyperlipidemia LDL goal < 70 10/19/2012  . Hypothyroid 10/19/2012  . DM 11/30/2009  . UNSPECIFIED ANEMIA 11/30/2009  . HYPERTENSION 11/30/2009  . DVT 11/30/2009  . BRONCHIECTASIS 11/30/2009  . COMPARTMENT SYNDROME UNSPECIFIED 11/30/2009  . ARTHRITIS, LEFT KNEE 09/10/2009    PT - End of Session Activity Tolerance: Patient tolerated treatment well General Behavior During Therapy: [facility]FL for tasks assessed/performed   Lurena Nida, PTA/CLT 01/31/2014, 9:09 AM

## 2014-02-02 ENCOUNTER — Ambulatory Visit (HOSPITAL_COMMUNITY)
Admission: RE | Admit: 2014-02-02 | Discharge: 2014-02-02 | Disposition: A | Discharge status: Home / Self Care | Payer: 59 | Source: Ambulatory Visit | Attending: Family Medicine | Admitting: Family Medicine

## 2014-02-02 DIAGNOSIS — IMO0001 Reserved for inherently not codable concepts without codable children: Secondary | ICD-10-CM | POA: Diagnosis not present

## 2014-02-02 NOTE — Progress Notes (Signed)
Physical Therapy Treatment Patient Details  Name: JABREE REBERT MRN: [id number] Date of Birth: 1951/04/03  Today's Date: 02/02/2014 Time: 0800-0847 PT Time Calculation (min): 47 min Visit#: 10 of 17  Charges:  therex 800-825 (25'), manual 825-830 (n/c), IFES/MHP [phone] (15')  Subjective: Symptoms/Limitations Symptoms: Patient comes today with new script from MD for trial of estim.  MD with continuation of therapy for 4 more weeks (script sent to scan) Pain Assessment Currently in Pain?: Yes Pain Score: 2  Pain Location: Scapula Pain Orientation: Left   Exercise/Treatments Stretches Corner Stretch: 3 reps;20 seconds Chest Stretch: Limitations Machines for Strengthening UBE (Upper Arm Bike): Backward, Resistance 3, 5' no rest breaks Standing Exercises Other Standing Exercises: push ups against counter 15 reps Other Standing Exercises: scapular push ups 20x at raised table Prone Exercises W Back: 15 reps Shoulder Extension: 15 reps Rows: 15 reps Upper Extremity Flexion with Stabilization: 10 reps;Limitations UE Flexion with Stabilization Limitations: alternating    Modalities Modalities: Moist Heat Manual Therapy Other Manual Therapy: MFR/STM to Lt subscapular region in prone f/b IFES/MHP Moist Heat Therapy Number Minutes Moist Heat: 15 Minutes Moist Heat Location: Other (comment) Electrical Stimulation Electrical Stimulation Location: Lt scapular region in prone position with MHP Electrical Stimulation Action: Lt subscapular region Electrical Stimulation Parameters: IFES hi/lo sweep intensity varied 10-13 Volts X 15 minutes Electrical Stimulation Goals: Pain  Physical Therapy Assessment and Plan PT Assessment and Plan Clinical Impression Statement: Pt comes today with new orders from MD to continue X 4 more weeks and trial of estim to help decrease pain in Lt scapular region. Able to increase reps today without difficulty or pain.  Pt contines to have tight  musculature Lt scapular region that resolves only 25-40% with manual techniques .  Added IFES with moist heat in prone to further relax muscularture and decrease painful symptoms.  Pt reported being painfree at end of session   PT Plan: Continue to progress strength and posture.  Progress to plank holds next visit.  Assess effects of IFES next visit.     Problem List Patient Active Problem List   Diagnosis Date Noted  . Stiffness of joints, not elsewhere classified, multiple sites 12/27/2013  . Cervicalgia 12/27/2013  . Hemoglobin C (Hb-C) 04/11/2013  . Hyperlipidemia LDL goal < 70 10/19/2012  . Hypothyroid 10/19/2012  . DM 11/30/2009  . UNSPECIFIED ANEMIA 11/30/2009  . HYPERTENSION 11/30/2009  . DVT 11/30/2009  . BRONCHIECTASIS 11/30/2009  . COMPARTMENT SYNDROME UNSPECIFIED 11/30/2009  . ARTHRITIS, LEFT KNEE 09/10/2009     Lurena Nida, PTA/CLT 02/02/2014, 9:14 AM

## 2014-02-07 ENCOUNTER — Telehealth (HOSPITAL_COMMUNITY): Payer: Self-pay | Admitting: Physical Therapy

## 2014-02-07 ENCOUNTER — Ambulatory Visit (HOSPITAL_COMMUNITY)
Admission: RE | Admit: 2014-02-07 | Discharge: 2014-02-07 | Disposition: A | Discharge status: Home / Self Care | Payer: 59 | Source: Ambulatory Visit | Attending: Family Medicine | Admitting: Family Medicine

## 2014-02-07 ENCOUNTER — Ambulatory Visit (HOSPITAL_COMMUNITY)
Admission: RE | Admit: 2014-02-07 | Discharge: 2014-02-07 | Disposition: A | Discharge status: Home / Self Care | Payer: 59 | Source: Ambulatory Visit | Attending: Physical Therapy | Admitting: Physical Therapy

## 2014-02-07 DIAGNOSIS — IMO0001 Reserved for inherently not codable concepts without codable children: Secondary | ICD-10-CM | POA: Diagnosis not present

## 2014-02-07 NOTE — Progress Notes (Signed)
Physical Therapy Treatment Patient Details  Name: Jordan Goodwin MRN: [id number] Date of Birth: 07-Feb-1951  Today's Date: 02/07/2014 Time: 7829-5621 PT Time Calculation (min): 38 min TE 3086-5784, Estim/Ice 6962-9528  Visit#: 11 of 17  Re-eval: 01/26/14 Assessment Diagnosis: Cervical Pain secondary to limited cervical spine stiffness.  Next MD Visit: 01/31/14 [name]    Subjective: Symptoms/Limitations Symptoms: Pt reports some sensations of numbness down the Lt arm the next day after estim treatment, though after that pt was painfree for 2 days. no complaints of pain today.  Pain Assessment Currently in Pain?: No/denies  Exercise/Treatments Stretches Levator Stretch: 2 reps;30 seconds Corner Stretch: 3 reps;30 seconds Standing Exercises Other Standing Exercises: Push Up Plus 2x10 at edge of table Seated Exercises Other Seated Exercise: Lat Slides x10 Prone Exercises Other Prone Exercise: Y, T, W, Extension (B) UE with 1# wt x10 Other Prone Exercise: Prone Press Up 10" x5    Physical Therapy Assessment and Plan PT Assessment and Plan Clinical Impression Statement: Pt reprots no complaints of pain since last treatment session, though some numbness was reported the day after treatment session; assess estim for numbness next treatment session.  Pt was able to increase scapular strengthening exercsies with 1# weight without complaints of pain, and only minimal VC for technique/alignement.  Re-assess next visit.  Pt will benefit from skilled therapeutic intervention in order to improve on the following deficits: Decreased activity tolerance;Decreased strength;Impaired sensation;Impaired flexibility;Increased fascial restricitons;Decreased range of motion;Improper body mechanics;Pain Rehab Potential: Good PT Frequency: Min 2X/week PT Duration: 4 weeks PT Treatment/Interventions: Therapeutic exercise;Modalities;Therapeutic activities;Patient/family education;Manual techniques;Other  (comment) PT Plan: Re-assessment next visit.     Goals PT Short Term Goals PT Short Term Goal 1: Patient will be able to side bend his head bilaterally to 40 degrees without pain or compensation by elevating shoulders, PT Short Term Goal 1 - Progress: Progressing toward goal PT Short Term Goal 3: Patient will be able to rotate head bilaterally 80 degrees to look over shoulder while driving.  PT Short Term Goal 3 - Progress: Progressing toward goal  Problem List Patient Active Problem List   Diagnosis Date Noted  . Stiffness of joints, not elsewhere classified, multiple sites 12/27/2013  . Cervicalgia 12/27/2013  . Hemoglobin C (Hb-C) 04/11/2013  . Hyperlipidemia LDL goal < 70 10/19/2012  . Hypothyroid 10/19/2012  . DM 11/30/2009  . UNSPECIFIED ANEMIA 11/30/2009  . HYPERTENSION 11/30/2009  . DVT 11/30/2009  . BRONCHIECTASIS 11/30/2009  . COMPARTMENT SYNDROME UNSPECIFIED 11/30/2009  . ARTHRITIS, LEFT KNEE 09/10/2009    PT - End of Session Activity Tolerance: Patient tolerated treatment well General Behavior During Therapy: [facility] for tasks assessed/performed   Thorne Wirz 02/07/2014, 9:30 AM

## 2014-02-09 ENCOUNTER — Ambulatory Visit (HOSPITAL_COMMUNITY)
Admission: RE | Admit: 2014-02-09 | Discharge: 2014-02-09 | Disposition: A | Discharge status: Home / Self Care | Payer: 59 | Source: Ambulatory Visit | Attending: Family Medicine | Admitting: Family Medicine

## 2014-02-09 DIAGNOSIS — IMO0001 Reserved for inherently not codable concepts without codable children: Secondary | ICD-10-CM | POA: Diagnosis not present

## 2014-02-09 NOTE — Progress Notes (Signed)
Physical Therapy Treatment Patient Details  Name: ARLYNN MCDERMID MRN: [id number] Date of Birth: [birth year]  Today's Date: 02/09/2014 Time: 0850-0925 PT Time Calculation (min): 35 min    Charges: Manual [phone], TherEx [phone] Visit#: 12 of 17  Re-eval: 02/23/14  Authorization: [employer]   Subjective: Symptoms/Limitations Symptoms: Patient notes minor numbness and tingling in posterior Lt UE that is relieved follwoing manual therapy. Pain also releived follwoign manual therapy. No pain noted with exercises justy difficulty with Lt UE performance secondary to weakness Pain Assessment Currently in Pain?: Yes Pain Score: 2   Exercise/Treatments Stretches Other Neck Stretches: Possterior shoulder stretch 103seconds Other Neck Stretches: Latissimus stretch 10x 3sec Theraband Exercises Scapula Retraction: 10 reps;Blue Shoulder Extension: 20 reps;10 reps;Green Rows: 20 reps;Blue Horizontal ABduction: Red;10 reps Other Theraband Exercises: Lower trpezius red T-band 10x Standing Exercises Other Standing Exercises: Overhead dumbbell matrix 3lb dumbbell 5x each  Manual Therapy Manual Therapy: Massage Massage: MFR/STM to Bil traps and scapular region, LT>Rt in prone position, seeratus anterior, subscapularis, teres minor/major, rhomboids  Physical Therapy Assessment and Plan PT Assessment and Plan Clinical Impression Statement: Session focused on increasign posterior shoulder mobility to decrease strain on axillary and radial nerves resulting in improved sensation and decreased numbness and tingling. Patient displayed continued difficulty with reaching overhead secondary to limited serratus anterior strength and limited latisimuss  and rhomboid mobility on LT UE.  following stretches ROM was much improved.  PT Plan: Conitnue TE to improve UE mobility and strength to improve ability to reach over head.     Goals PT Short Term Goals PT Short Term Goal 1: Patient will be able to side bend his  head bilaterally to 40 degrees without pain or compensation by elevating shoulders, PT Short Term Goal 1 - Progress: Progressing toward goal PT Short Term Goal 2: Patient will be able to sit > 1 hours wihtout pain >2/10 PT Short Term Goal 2 - Progress: Met PT Short Term Goal 3: Patient will be able to rotate head bilaterally 80 degrees to look over shoulder while driving.  PT Short Term Goal 3 - Progress: Progressing toward goal PT Short Term Goal 4: Patient will be able to flex and abduction bilateral UE to 160 degrees to be able to reach overhead to  high shelf PT Short Term Goal 4 - Progress: Met PT Long Term Goals PT Long Term Goal 1: Patient will be able to sit > 4 hours witotut pain to be able to toelrate normal work PT Long Term Goal 1 - Progress: Met PT Long Term Goal 2: Patient will demosntrate a deep neck flexion endurance test score of >1 minute indicating improved cervical spine stability PT Long Term Goal 2 - Progress: Progressing toward goal Long Term Goal 3: Patient will demosntrate a Lowr trapezius strength of 4/5 MMT to indicate improving posture.  Long Term Goal 3 Progress: Progressing toward goal  Problem List Patient Active Problem List   Diagnosis Date Noted  . Stiffness of joints, not elsewhere classified, multiple sites 12/27/2013  . Cervicalgia 12/27/2013  . Hemoglobin C (Hb-C) 04/11/2013  . Hyperlipidemia LDL goal < 70 10/19/2012  . Hypothyroid 10/19/2012  . DM 11/30/2009  . UNSPECIFIED ANEMIA 11/30/2009  . HYPERTENSION 11/30/2009  . DVT 11/30/2009  . BRONCHIECTASIS 11/30/2009  . COMPARTMENT SYNDROME UNSPECIFIED 11/30/2009  . ARTHRITIS, LEFT KNEE 09/10/2009    PT - End of Session Activity Tolerance: Patient tolerated treatment well General Behavior During Therapy: [facility] for tasks assessed/performed  GP    Brienne Liguori,  Pessy Delamar R 02/09/2014, 9:32 AM

## 2014-02-14 ENCOUNTER — Ambulatory Visit (HOSPITAL_COMMUNITY)
Admission: RE | Admit: 2014-02-14 | Discharge: 2014-02-14 | Disposition: A | Discharge status: Home / Self Care | Payer: 59 | Source: Ambulatory Visit | Attending: Family Medicine | Admitting: Family Medicine

## 2014-02-14 DIAGNOSIS — IMO0001 Reserved for inherently not codable concepts without codable children: Secondary | ICD-10-CM | POA: Diagnosis not present

## 2014-02-14 NOTE — Progress Notes (Signed)
Physical Therapy Treatment Patient Details  Name: Jordan Goodwin MRN: [id number] Date of Birth: September 19, 1950  Today's Date: 02/14/2014 Time: 0800-0840 PT Time Calculation (min): 40 min Charge: TE 4098-1191, Manual [id number]  Visit#: 13 of 17  Re-eval: 02/23/14 Assessment Diagnosis: Cervical Pain secondary to limited cervical spine stiffness.  Next MD Visit: 03/02/2014 [name]  Authorization: [employer]  Authorization Time Period:    Authorization Visit#:   of     Subjective: Symptoms/Limitations Symptoms: Pt states he is getting better just cant get rid of pain infront of shoulder blade Pain Assessment Currently in Pain?: Yes Pain Score: 2  Pain Location: Scapula Pain Orientation: Anterior;Left;Posterior  Objective:   Exercise/Treatments Stretches Other Neck Stretches: Possterior shoulder stretch 103seconds Other Neck Stretches: Latissimus stretch 10x 3sec Theraband Exercises Scapula Retraction: 15 reps;Blue Shoulder Extension: 20 reps;10 reps;Blue Rows: 20 reps;Blue Other Theraband Exercises: Lower trpezius red T-band 10x Standing Exercises Other Standing Exercises: Overhead dumbbell matrix 3lb dumbbell 5x each   Manual Therapy Manual Therapy: Massage Massage: MFR/STM focus on Lt scapular regopm. upper and mid traps, rhomboids, supra and infraspinalis and teres major/minor  Physical Therapy Assessment and Plan PT Assessment and Plan Clinical Impression Statement: Continued session focus on improving scapular mobilty with therapist facilitation to improve upward rotation with abduction and stretches to improve shoulder flexoin ROM.  Manual techniques complete to reduce fascial restrictions Lt rhomboids and intfraspinalis, able to resolve 80% with relief stated following.   PT Plan: Conitnue TE to improve UE mobility and strength to improve ability to reach over head.     Goals PT Short Term Goals PT Short Term Goal 1: Patient will be able to side bend his head bilaterally  to 40 degrees without pain or compensation by elevating shoulders, PT Short Term Goal 3: Patient will be able to rotate head bilaterally 80 degrees to look over shoulder while driving.  PT Short Term Goal 5: Patient will be able to horizontally abduct bilateral UE to >35 degrees indicatign improved pectoral mobility and decreased roundign forward of shoulder PT Short Term Goal 5 - Progress: Progressing toward goal PT Long Term Goals PT Long Term Goal 1: Patient will be able to sit > 4 hours witotut pain to be able to toelrate normal work PT Long Term Goal 2: Patient will demosntrate a deep neck flexion endurance test score of >1 minute indicating improved cervical spine stability Long Term Goal 3: Patient will demosntrate a Lowr trapezius strength of 4/5 MMT to indicate improving posture.  Long Term Goal 3 Progress: Progressing toward goal  Problem List Patient Active Problem List   Diagnosis Date Noted  . Stiffness of joints, not elsewhere classified, multiple sites 12/27/2013  . Cervicalgia 12/27/2013  . Hemoglobin C (Hb-C) 04/11/2013  . Hyperlipidemia LDL goal < 70 10/19/2012  . Hypothyroid 10/19/2012  . DM 11/30/2009  . UNSPECIFIED ANEMIA 11/30/2009  . HYPERTENSION 11/30/2009  . DVT 11/30/2009  . BRONCHIECTASIS 11/30/2009  . COMPARTMENT SYNDROME UNSPECIFIED 11/30/2009  . ARTHRITIS, LEFT KNEE 09/10/2009    PT - End of Session Activity Tolerance: Patient tolerated treatment well General Behavior During Therapy: [facility] for tasks assessed/performed  GP    Juel Burrow 02/14/2014, 9:02 AM

## 2014-02-21 ENCOUNTER — Ambulatory Visit (HOSPITAL_COMMUNITY)
Admission: RE | Admit: 2014-02-21 | Discharge: 2014-02-21 | Disposition: A | Discharge status: Home / Self Care | Payer: 59 | Source: Ambulatory Visit | Attending: Family Medicine | Admitting: Family Medicine

## 2014-02-21 DIAGNOSIS — IMO0001 Reserved for inherently not codable concepts without codable children: Secondary | ICD-10-CM | POA: Diagnosis not present

## 2014-02-21 NOTE — Progress Notes (Signed)
Physical Therapy Treatment Patient Details  Name: Jordan Goodwin MRN: [id number] Date of Birth: 04/13/1951  Today's Date: 02/21/2014 Time: 0800-0845 PT Time Calculation (min): 45 min TE 0800-0840, MT 0840-0845  Visit#: 14 of 17  Re-eval: 02/23/14 Assessment Diagnosis: Cervical Pain secondary to limited cervical spine stiffness.  Next MD Visit: 03/02/2014 Jordan Goodwin  Subjective: Symptoms/Limitations Symptoms: Pt reports he increased complaints of pain in the Lt thoracic spine this past week (after sitting in a work meeting on Wednesday for 5 hours, then driving to [place] the next day for 6 hours) with pain reported at 5/10.  Pt reprots pain did decrease by the next day and is currently 1/10.  Pain Assessment Currently in Pain?: Yes Pain Score: 1  Pain Location: Thoracic (~T6-T10) Pain Orientation: Left Pain Type: Chronic pain   Exercise/Treatments Stretches Upper Trapezius Stretch: 2 reps;30 seconds Levator Stretch: 2 reps;30 seconds Other Neck Stretches: Posterior Shoulder Stretch 20" x3 Other Neck Stretches: Latissimus Stretch 20" x3 Machines for Strengthening UBE (Upper Arm Bike): Backward, Resistance 3, 5' no rest breaks Seated Exercises Other Seated Exercise: Cervical Rotation, 10" x5 Supine Exercises Other Supine Exercise: Thoracic Mobility (1) Lat Slides (2) Alternating shoulder FL Prone Exercises Rows: 10 reps (2" hold) Other Prone Exercise: Y, T,  2" Hold x10 Other Prone Exercise: Prone Press Up 10" x5  Manual Therapy Other Manual Therapy: Rolling stick to thoracic spine, (B) levator/UT region  Physical Therapy Assessment and Plan PT Assessment and Plan Clinical Impression Statement: Pt reports mild pain today only at 1/10 in the Lt midback region with only soreness with palpation along ES of Lt ~T6-10.   Pt does report at work his computer monitor is to the Lt of his body so he is constantly looking that way to do his work, though he uses his Rt hand to control the  mouse.  Educated pt to adjust work station for prior ergonomics and body mechanics to improve posture and decrease muscle tone during work activities.   Pt will benefit from skilled therapeutic intervention in order to improve on the following deficits: Decreased activity tolerance;Decreased strength;Impaired sensation;Impaired flexibility;Increased fascial restricitons;Decreased range of motion;Improper body mechanics;Pain Rehab Potential: Good PT Plan: Re-assess next visit.    Goals PT Short Term Goals PT Short Term Goal 1: Patient will be able to side bend his head bilaterally to 40 degrees without pain or compensation by elevating shoulders, PT Long Term Goals PT Long Term Goal 1: Patient will be able to sit > 4 hours witotut pain to be able to toelrate normal work PT Long Term Goal 1 - Progress: Met PT Long Term Goal 2: Patient will demosntrate a deep neck flexion endurance test score of >1 minute indicating improved cervical spine stability PT Long Term Goal 2 - Progress: Progressing toward goal Long Term Goal 3: Patient will demosntrate a Lowr trapezius strength of 4/5 MMT to indicate improving posture.  Long Term Goal 3 Progress: Progressing toward goal  Problem List Patient Active Problem List   Diagnosis Date Noted  . Stiffness of joints, not elsewhere classified, multiple sites 12/27/2013  . Cervicalgia 12/27/2013  . Hemoglobin C (Hb-C) 04/11/2013  . Hyperlipidemia LDL goal < 70 10/19/2012  . Hypothyroid 10/19/2012  . DM 11/30/2009  . UNSPECIFIED ANEMIA 11/30/2009  . HYPERTENSION 11/30/2009  . DVT 11/30/2009  . BRONCHIECTASIS 11/30/2009  . COMPARTMENT SYNDROME UNSPECIFIED 11/30/2009  . ARTHRITIS, LEFT KNEE 09/10/2009    PT - End of Session Activity Tolerance: Patient tolerated treatment well General Behavior   During Therapy: [facility] for tasks assessed/performed   Jordan Goodwin 02/21/2014, 8:54 AM

## 2014-02-23 ENCOUNTER — Ambulatory Visit (HOSPITAL_COMMUNITY)
Admission: RE | Admit: 2014-02-23 | Discharge: 2014-02-23 | Disposition: A | Discharge status: Home / Self Care | Payer: 59 | Source: Ambulatory Visit | Attending: Family Medicine | Admitting: Family Medicine

## 2014-02-23 DIAGNOSIS — IMO0001 Reserved for inherently not codable concepts without codable children: Secondary | ICD-10-CM | POA: Diagnosis not present

## 2014-02-23 NOTE — Evaluation (Signed)
Physical Therapy Reassessment  Patient Details  Name: Jordan Goodwin MRN: [id number] Date of Birth: March 24, 1951  Today's Date: 02/23/2014 Time: 7416-3845 PT Time Calculation (min): 42 min      Charges: 1 MMT, [place], Manual [phone], TE 364-680        Visit#: 15 of 17  Re-eval: 03/25/14 Assessment Diagnosis: Cervical Pain secondary to limited cervical spine stiffness.  Next MD Visit: 03/02/2014 [name]  Authorization: [employer]    Authorization Time Period:    Authorization Visit#:   of     Past Medical History:  Past Medical History  Diagnosis Date  . Hypertension   . Diabetes mellitus   . Mixed hyperlipidemia     low HDL  . 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 DVT (deep venous thrombosis) 08/2009    left lower extremity and had a DVT inthe left posterior tibial vein  . Murmur    Past Surgical History:  Past Surgical History  Procedure Laterality Date  . Tonsillectomy  1962  . Retinal detachment surgery      x's 2  . Cataract extraction, bilateral    . 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 Knee arthroscopy  2012    left knee/torn meniscus    Subjective Symptoms/Limitations Symptoms: Patient reports significantly improved pain and mobility with pain only just inferior to Lt scapula Pain Assessment Currently in Pain?: Yes Pain Score: 1  Pain Location: Scapula Pain Orientation: Left;Lower Pain Type: Chronic pain  Cognition/Observation Observation/Other Assessments Observations: Sitting posture: slight forward head, slightforward rounded shoulders.   Sensation/Coordination/Flexibility/Functional Tests Functional Tests Functional Tests: FOTO: 23% limited  Assessment RUE AROM (degrees) Right Shoulder Flexion: 170 Degrees Right Shoulder ABduction: 158 Degrees Right Shoulder Horizontal ABduction: 15 Degrees Cervical AROM Cervical Flexion: 50 Cervical Extension: 50 Cervical - Right Side Bend: 30 Cervical - Left  Side Bend: 30 Cervical - Right Rotation: 70 Cervical - Left Rotation: 72 Cervical Strength Cervical Flexion: 5/5 Cervical Extension: 5/5 Cervical - Right Side Bend: 5/5 Cervical - Left Side Bend: 5/5 Cervical - Right Rotation: 5/5 Cervical - Left Rotation: 5/5 Lumbar AROM Overall Lumbar AROM: Within functional limits for tasks performed  Exercise/Treatments Theraband Exercises Scapula Retraction: 20 reps;Blue Shoulder Extension: 20 reps;Blue Rows: 20 reps;Blue Horizontal ABduction: Red;10 reps Other Theraband Exercises: Lower trpezius red T-band 20x Standing Exercises Other Standing Exercises: 4 way pick up and reach 5lb duumbbell 10x  Manual therapy: pectoral release  Physical Therapy Assessment and Plan PT Assessment and Plan Clinical Impression Statement: Patient is making good progress towards all goals but  sill noted limitation in pectoarl mobility and weakness in lower trapezus resulting in forward shoulder posture placing lower trapezius muscle under increased strainat rest resultign in pain with prolonged sitting. patient will beneift from skille dphsyical therapy 2x a week for 4 more weeks to reach remaining goal s includign being pain free with prolonged sitting > 1 hour.  PT Frequency: Min 2X/week PT Duration: 4 weeks PT Plan: Conitnue phsyical therapy 2x a week for 4 more weeks to reach remaining long term goals. Focus of therapy on increasing pectoral mobility and strengthening of posterior scapular staqbilizers including mid and lower trapezius and rhomboids.     Goals PT Short Term Goals PT Short Term Goal 1: Patient will be able to side bend his head bilaterally to 40 degrees without pain or compensation by elevating shoulders, PT Short Term Goal 1 - Progress:  Progressing toward goal PT Short Term Goal 2: Patient will be able to sit > 1 hours wihtout pain >2/10 PT Short Term Goal 2 - Progress: Met PT Short Term Goal 3: Patient will be able to rotate head  bilaterally 80 degrees to look over shoulder while driving.  PT Short Term Goal 3 - Progress: Progressing toward goal PT Short Term Goal 4: Patient will be able to flex and abduction bilateral UE to 160 degrees to be able to reach overhead to  high shelf PT Short Term Goal 4 - Progress: Met PT Short Term Goal 5: Patient will be able to horizontally abduct bilateral UE to >35 degrees indicatign improved pectoral mobility and decreased roundign forward of shoulder PT Short Term Goal 5 - Progress: Progressing toward goal PT Long Term Goals PT Long Term Goal 1: Patient will be able to sit > 4 hours witotut pain to be able to toelrate normal work PT Long Term Goal 1 - Progress: Met PT Long Term Goal 2: Patient will demosntrate a deep neck flexion endurance test score of >1 minute indicating improved cervical spine stability PT Long Term Goal 2 - Progress: Met Long Term Goal 3: Patient will demosntrate a Lowr trapezius strength of 4/5 MMT to indicate improving posture.  Long Term Goal 3 Progress: Progressing toward goal  Problem List Patient Active Problem List   Diagnosis Date Noted  . Stiffness of joints, not elsewhere classified, multiple sites 12/27/2013  . Cervicalgia 12/27/2013  . Hemoglobin C (Hb-C) 04/11/2013  . Hyperlipidemia LDL goal < 70 10/19/2012  . Hypothyroid 10/19/2012  . DM 11/30/2009  . UNSPECIFIED ANEMIA 11/30/2009  . HYPERTENSION 11/30/2009  . DVT 11/30/2009  . BRONCHIECTASIS 11/30/2009  . COMPARTMENT SYNDROME UNSPECIFIED 11/30/2009  . ARTHRITIS, LEFT KNEE 09/10/2009    PT - End of Session Activity Tolerance: Patient tolerated treatment well General Behavior During Therapy: [facility] for tasks assessed/performed  GP    Ajai Terhaar R 02/23/2014, 8:51 AM  Physician Documentation Your signature is required to indicate approval of the treatment plan as stated above.  Please sign and either send electronically or make a copy of this report for your files and return this  physician signed original.   Please mark one 1.__approve of plan  2. ___approve of plan with the following conditions.   ______________________________                                                          _____________________ Physician Signature                                                                                                             Date

## 2014-02-28 ENCOUNTER — Ambulatory Visit (HOSPITAL_COMMUNITY)
Admission: RE | Admit: 2014-02-28 | Discharge: 2014-02-28 | Disposition: A | Discharge status: Home / Self Care | Payer: 59 | Source: Ambulatory Visit | Attending: Family Medicine | Admitting: Family Medicine

## 2014-02-28 DIAGNOSIS — IMO0001 Reserved for inherently not codable concepts without codable children: Secondary | ICD-10-CM | POA: Diagnosis not present

## 2014-02-28 NOTE — Progress Notes (Signed)
Physical Therapy Treatment Patient Details  Name: Jordan HissJohn L Goodwin MRN: [id number] Date of Birth: [birth year]  Today's Date: 02/28/2014 Time: [id number] PT Time Calculation (min): 34 min    Charges: TE [phone] Visit#: 16 of 17  Re-eval: 03/25/14 Assessment Diagnosis: Cervical Pain secondary to limited cervical spine stiffness.  Next MD Visit: 03/02/2014 [name]  Authorization: [employer]   Subjective: Symptoms/Limitations Symptoms: No pain, patient states he's feeling good today.  Pain Assessment Currently in Pain?: No/denies  Exercise/Treatments Stretches Upper Trapezius Stretch: 2 reps;30 seconds Levator Stretch: 2 reps;30 seconds Other Neck Stretches: Latissimus Stretch 20" x3 Theraband Exercises Scapula Retraction: 20 reps;Blue Shoulder Extension: Blue;Limitations;10 reps Shoulder Extension Limitations: 3 way Rows: Blue;10 reps Horizontal ABduction: Red;10 reps Other Theraband Exercises: I,Y  red T-band 20x Other Theraband Exercises: Back hand lunge with green T-band 10x Standing Exercises Other Standing Exercises: 2 way pick up and ounch 5lb/3lb duumbbells 10x Other Standing Exercises: Overhead dumbbell matrix 5lb dumbbell 5x each Seated Exercises Other Seated Exercise: UE 3D weight shift 10x  Physical Therapy Assessment and Plan PT Assessment and Plan Clinical Impression Statement: Session focused om strenghtieng shoulder and cervical stabilizers with patient noting no pain throughtou session but demosntrated difficulty with exercise performance with Lt UE due to weakness, Patient required verbal and tactile cuign for correct performance of Lt UE strengtheing exercises PT Plan: Focus of therapy on increasing pectoral mobility and strengthening of posterior scapular staqbilizers including mid and lower trapezius and rhomboids.     Goals PT Short Term Goals PT Short Term Goal 1: Patient will be able to side bend his head bilaterally to 40 degrees without pain or compensation  by elevating shoulders, PT Short Term Goal 1 - Progress: Progressing toward goal PT Short Term Goal 3: Patient will be able to rotate head bilaterally 80 degrees to look over shoulder while driving.  PT Short Term Goal 3 - Progress: Progressing toward goal PT Short Term Goal 5: Patient will be able to horizontally abduct bilateral UE to >35 degrees indicatign improved pectoral mobility and decreased roundign forward of shoulder PT Short Term Goal 5 - Progress: Progressing toward goal PT Long Term Goals Long Term Goal 3: Patient will demosntrate a Lowr trapezius strength of 4/5 MMT to indicate improving posture.  Long Term Goal 3 Progress: Progressing toward goal  Problem List Patient Active Problem List   Diagnosis Date Noted  . Stiffness of joints, not elsewhere classified, multiple sites 12/27/2013  . Cervicalgia 12/27/2013  . Hemoglobin C (Hb-C) 04/11/2013  . Hyperlipidemia LDL goal < 70 10/19/2012  . Hypothyroid 10/19/2012  . DM 11/30/2009  . UNSPECIFIED ANEMIA 11/30/2009  . HYPERTENSION 11/30/2009  . DVT 11/30/2009  . BRONCHIECTASIS 11/30/2009  . COMPARTMENT SYNDROME UNSPECIFIED 11/30/2009  . ARTHRITIS, LEFT KNEE 09/10/2009    PT - End of Session Activity Tolerance: Patient tolerated treatment well  GP    Samul Mcinroy R 02/28/2014, 8:38 AM

## 2014-03-02 ENCOUNTER — Ambulatory Visit (HOSPITAL_COMMUNITY)
Admission: RE | Admit: 2014-03-02 | Discharge: 2014-03-02 | Disposition: A | Discharge status: Home / Self Care | Payer: 59 | Source: Ambulatory Visit | Attending: Family Medicine | Admitting: Family Medicine

## 2014-03-02 DIAGNOSIS — M542 Cervicalgia: Secondary | ICD-10-CM | POA: Diagnosis not present

## 2014-03-02 DIAGNOSIS — I10 Essential (primary) hypertension: Secondary | ICD-10-CM | POA: Diagnosis not present

## 2014-03-02 DIAGNOSIS — E119 Type 2 diabetes mellitus without complications: Secondary | ICD-10-CM | POA: Diagnosis not present

## 2014-03-02 DIAGNOSIS — Z5189 Encounter for other specified aftercare: Secondary | ICD-10-CM | POA: Diagnosis present

## 2014-03-02 DIAGNOSIS — M2560 Stiffness of unspecified joint, not elsewhere classified: Secondary | ICD-10-CM | POA: Diagnosis not present

## 2014-03-02 NOTE — Progress Notes (Signed)
Physical Therapy Treatment Patient Details  Name: Jordan HissJohn L Goodwin MRN: [id number] Date of Birth: 15-Mar-1951  Today's Date: 03/02/2014 Time: 0802-0843 PT Time Calculation (min): 41 min Charge: TE [id number]  Visit#: 17 of 23  Re-eval: 03/25/14 Assessment Diagnosis: Cervical Pain secondary to limited cervical spine stiffness.  Next MD Visit: 03/06/2014 [name]  Authorization: [employer]  Authorization Time Period:    Authorization Visit#:   of     Subjective: Symptoms/Limitations Symptoms: No pain, patient states he's feeling good today.  Pain Assessment Currently in Pain?: No/denies  Objective:  Exercise/Treatments Stretches Upper Trapezius Stretch: 3 reps;30 seconds Levator Stretch: 3 reps;30 seconds Other Neck Stretches: Latissimus Stretch 20" x3 Theraband Exercises Scapula Retraction: 20 reps;Blue Shoulder Extension: Blue;20 reps Rows: Blue;20 reps Horizontal ABduction: Red;10 reps Other Theraband Exercises: I,Y  red T-band 20x Other Theraband Exercises: Back hand lunge with green T-band 10x;Lower traps pull down 20x blue Standing Exercises Other Standing Exercises: 2 way pick up and ounch 5lb/3lb duumbbells 10x Other Standing Exercises: Overhead dumbbell matrix 5lb dumbbell 5x each Seated Exercises Other Seated Exercise: UE Ground matrix at mat 10x each UE    Physical Therapy Assessment and Plan PT Assessment and Plan Clinical Impression Statement: Progressed scapular stability with UE ground matrix with therapist facilitaiotn to imporve scapular mobility with horiszontal abduction PT Plan: Focus of therapy on increasing pectoral mobility and strengthening of posterior scapular staqbilizers including mid and lower trapezius and rhomboids.     Goals PT Short Term Goals PT Short Term Goal 1: Patient will be able to side bend his head bilaterally to 40 degrees without pain or compensation by elevating shoulders, PT Short Term Goal 1 - Progress: Progressing toward goal PT  Short Term Goal 2: Patient will be able to sit > 1 hours wihtout pain >2/10 PT Short Term Goal 3: Patient will be able to rotate head bilaterally 80 degrees to look over shoulder while driving.  PT Short Term Goal 3 - Progress: Progressing toward goal PT Short Term Goal 4: Patient will be able to flex and abduction bilateral UE to 160 degrees to be able to reach overhead to  high shelf PT Short Term Goal 4 - Progress: Progressing toward goal PT Short Term Goal 5: Patient will be able to horizontally abduct bilateral UE to >35 degrees indicatign improved pectoral mobility and decreased roundign forward of shoulder PT Short Term Goal 5 - Progress: Progressing toward goal PT Long Term Goals PT Long Term Goal 1: Patient will be able to sit > 4 hours witotut pain to be able to toelrate normal work PT Long Term Goal 2: Patient will demosntrate a deep neck flexion endurance test score of >1 minute indicating improved cervical spine stability Long Term Goal 3: Patient will demosntrate a Lowr trapezius strength of 4/5 MMT to indicate improving posture.   Problem List Patient Active Problem List   Diagnosis Date Noted  . Stiffness of joints, not elsewhere classified, multiple sites 12/27/2013  . Cervicalgia 12/27/2013  . Hemoglobin C (Hb-C) 04/11/2013  . Hyperlipidemia LDL goal < 70 10/19/2012  . Hypothyroid 10/19/2012  . DM 11/30/2009  . UNSPECIFIED ANEMIA 11/30/2009  . HYPERTENSION 11/30/2009  . DVT 11/30/2009  . BRONCHIECTASIS 11/30/2009  . COMPARTMENT SYNDROME UNSPECIFIED 11/30/2009  . ARTHRITIS, LEFT KNEE 09/10/2009    PT - End of Session Activity Tolerance: Patient tolerated treatment well General Behavior During Therapy: [facility] for tasks assessed/performed  GP    Juel BurrowCockerham, Casey Jo 03/02/2014, 8:40 AM

## 2014-03-07 ENCOUNTER — Ambulatory Visit (HOSPITAL_COMMUNITY)
Admission: RE | Admit: 2014-03-07 | Discharge: 2014-03-07 | Disposition: A | Discharge status: Home / Self Care | Payer: 59 | Source: Ambulatory Visit | Attending: Family Medicine | Admitting: Family Medicine

## 2014-03-07 DIAGNOSIS — Z5189 Encounter for other specified aftercare: Secondary | ICD-10-CM | POA: Diagnosis not present

## 2014-03-07 NOTE — Progress Notes (Signed)
Physical Therapy Treatment Patient Details  Name: Dorise HissJohn L Metallo MRN: [id number] Date of Birth: 07/25/1950  Today's Date: 03/07/2014 Time: 0801-0842 PT Time Calculation (min): 41 min Charge: TE [id number]  Visit#: 18 of 23  Re-eval: 03/25/14 Assessment Diagnosis: Cervical Pain secondary to limited cervical spine stiffness.  Next MD Visit: [name] when done with PT  Authorization: [employer]  Authorization Time Period:    Authorization Visit#:   of     Subjective: Symptoms/Limitations Symptoms: Pt reported pain minimum, just a slight pinch Lt shoulder Pain Assessment Currently in Pain?: Yes Pain Score: 1  Pain Location: Scapula Pain Orientation: Left  Objective:   Exercise/Treatments Stretches Upper Trapezius Stretch: 3 reps;30 seconds Other Neck Stretches: Latissimus Stretch 20" x3 Machines for Strengthening UBE (Upper Arm Bike): Backward, Resistance 3, 5' no rest breaks Theraband Exercises Scapula Retraction: 20 reps;Blue Shoulder Extension: Blue;20 reps Rows: Blue;20 reps Other Theraband Exercises: I,Y  red T-band 20x Other Theraband Exercises: Back hand lunge with green T-band 10x;Lower traps pull down 20x blue Standing Exercises Other Standing Exercises: 2 way pick up and ounch 5lb duumbbells 10x Other Standing Exercises: Overhead dumbbell matrix 5lb dumbbell 10x each Seated Exercises Other Seated Exercise: UE Ground matrix at mat 10x each UE    Physical Therapy Assessment and Plan PT Assessment and Plan Clinical Impression Statement: Therapist facilitation with UE ground matrix to improve scapular mobility with movements.  Noted improved ROM with horizontal abduction this session.  No reports of pain through session.   PT Plan: Focus of therapy on increasing pectoral mobility and strengthening of posterior scapular staqbilizers including mid and lower trapezius and rhomboids.     Goals PT Short Term Goals PT Short Term Goal 1: Patient will be able to side bend his  head bilaterally to 40 degrees without pain or compensation by elevating shoulders, PT Short Term Goal 1 - Progress: Progressing toward goal PT Short Term Goal 3: Patient will be able to rotate head bilaterally 80 degrees to look over shoulder while driving.  PT Short Term Goal 3 - Progress: Progressing toward goal PT Short Term Goal 4: Patient will be able to flex and abduction bilateral UE to 160 degrees to be able to reach overhead to  high shelf PT Short Term Goal 4 - Progress: Progressing toward goal PT Short Term Goal 5: Patient will be able to horizontally abduct bilateral UE to >35 degrees indicatign improved pectoral mobility and decreased roundign forward of shoulder PT Short Term Goal 5 - Progress: Progressing toward goal PT Long Term Goals Long Term Goal 3: Patient will demosntrate a Lowr trapezius strength of 4/5 MMT to indicate improving posture.  Long Term Goal 3 Progress: Progressing toward goal  Problem List Patient Active Problem List   Diagnosis Date Noted  . Stiffness of joints, not elsewhere classified, multiple sites 12/27/2013  . Cervicalgia 12/27/2013  . Hemoglobin C (Hb-C) 04/11/2013  . Hyperlipidemia LDL goal < 70 10/19/2012  . Hypothyroid 10/19/2012  . DM 11/30/2009  . UNSPECIFIED ANEMIA 11/30/2009  . HYPERTENSION 11/30/2009  . DVT 11/30/2009  . BRONCHIECTASIS 11/30/2009  . COMPARTMENT SYNDROME UNSPECIFIED 11/30/2009  . ARTHRITIS, LEFT KNEE 09/10/2009    PT - End of Session Activity Tolerance: Patient tolerated treatment well General Behavior During Therapy: [facility] for tasks assessed/performed  GP    Juel BurrowCockerham, Arita Severtson Jo 03/07/2014, 8:43 AM

## 2014-03-10 ENCOUNTER — Other Ambulatory Visit: Payer: Self-pay | Admitting: Cardiovascular Disease

## 2014-03-13 NOTE — Telephone Encounter (Signed)
Rx was sent to pharmacy electronically. 

## 2014-03-15 ENCOUNTER — Ambulatory Visit (HOSPITAL_COMMUNITY)
Admission: RE | Admit: 2014-03-15 | Discharge: 2014-03-15 | Disposition: A | Discharge status: Home / Self Care | Payer: 59 | Source: Ambulatory Visit | Attending: Family Medicine | Admitting: Family Medicine

## 2014-03-15 DIAGNOSIS — Z5189 Encounter for other specified aftercare: Secondary | ICD-10-CM | POA: Diagnosis not present

## 2014-03-15 NOTE — Progress Notes (Signed)
Physical Therapy Treatment Patient Details  Name: Jordan Goodwin MRN: [id number] Date of Birth: 1950-11-16  Today's Date: 03/15/2014 Time: [id number] PT Time Calculation (min): 38 min Visit#: 19 of 23  Re-eval: 03/25/14 Authorization: [employer]  Charges:  therex [phone] (30'), manual [phone] (8')  Subjective: Symptoms/Limitations Symptoms: Pt reports currently no pain, however states last time he was here his pain was high so he backed off all his exercises and activities for a few days and his pain went away.  Pt states he's hoping it does not increase after todays session. Pain Assessment Currently in Pain?: No/denies   Exercise/Treatments Machines for Strengthening UBE (Upper Arm Bike): Backward, Resistance 3, 5' no rest breaks Theraband Exercises Scapula Retraction: 20 reps;Blue Shoulder Extension: Blue;20 reps Rows: Blue;20 reps Horizontal ABduction: Red;20 reps Other Theraband Exercises: I,Y  green T-band 15x Other Theraband Exercises: Back hand lunge with green T-band 10x;Lower traps pull down 20x blue Standing Exercises Other Standing Exercises: 2 way pick up and punch 5lb dumbbells 10x Other Standing Exercises: Overhead dumbbell matrix 5lb dumbbell 10x each Seated Exercises Other Seated Exercise: UE Ground matrix at mat 10x each UE with mat all way elevated  Manual Therapy Manual Therapy: Other (comment) Other Manual Therapy: MFR/ DTM to Lt upper trap and rhomboids  Physical Therapy Assessment and Plan PT Assessment and Plan Clinical Impression Statement: Continued need for Therapist facilitation with UE ground matrix to improve scapular mobility with movements due to weak core and UE. Overall reduction in tightness with only one small palpable spasm with manual in Lt rhomboid.  Able to resolve with manual techniques.  General core stiffness noted with UE activities.  No reports of pain through session PT Plan: Focus of therapy on increasing pectoral mobility and  strengthening of posterior scapular staqbilizers including mid and lower trapezius and rhomboids.   Assess pain next visit.     Problem List Patient Active Problem List   Diagnosis Date Noted  . Stiffness of joints, not elsewhere classified, multiple sites 12/27/2013  . Cervicalgia 12/27/2013  . Hemoglobin C (Hb-C) 04/11/2013  . Hyperlipidemia LDL goal < 70 10/19/2012  . Hypothyroid 10/19/2012  . DM 11/30/2009  . UNSPECIFIED ANEMIA 11/30/2009  . HYPERTENSION 11/30/2009  . DVT 11/30/2009  . BRONCHIECTASIS 11/30/2009  . COMPARTMENT SYNDROME UNSPECIFIED 11/30/2009  . ARTHRITIS, LEFT KNEE 09/10/2009    PT - End of Session Activity Tolerance: Patient tolerated treatment well General Behavior During Therapy: [facility] for tasks assessed/performed  GP    Lurena NidaAmy B Frazier, PTA/CLT 03/15/2014, 9:31 AM

## 2014-03-17 ENCOUNTER — Ambulatory Visit (HOSPITAL_COMMUNITY)
Admission: RE | Admit: 2014-03-17 | Discharge: 2014-03-17 | Disposition: A | Discharge status: Home / Self Care | Payer: 59 | Source: Ambulatory Visit | Attending: Physical Therapy | Admitting: Physical Therapy

## 2014-03-17 DIAGNOSIS — Z5189 Encounter for other specified aftercare: Secondary | ICD-10-CM | POA: Diagnosis not present

## 2014-03-17 NOTE — Progress Notes (Signed)
Physical Therapy Treatment Patient Details  Name: Jordan Goodwin MRN: [id number] Date of Birth: [birth year]  Today's Date: 03/17/2014 Time: [id number] PT Time Calculation (min): 42 min Charge: TE [id number], Manual [id number]  Visit#: 20 of 23  Re-eval: 03/25/14    Authorization: [employer]  Authorization Time Period:    Authorization Visit#:   of     Subjective: Symptoms/Limitations Symptoms: Pt stated minor pain continues on front of scapula. Pain Assessment Currently in Pain?: Yes Pain Score: 1  Pain Location: Scapula Pain Orientation: Left;Anterior  Objective:  Exercise/Treatments Stretches Other Neck Stretches: Doorway pec stretch 3 positions 3x 20" Other Neck Stretches: Latissimus Stretch 20" x3 Machines for Strengthening UBE (Upper Arm Bike): Backward, Resistance 3, 5' no rest breaks Theraband Exercises Scapula Retraction: 20 reps;Blue Shoulder Extension: Blue;20 reps Rows: Blue;20 reps Horizontal ABduction: Green;20 reps Other Theraband Exercises: Back hand lunge with green T-band 10x;Lower traps pull down 20x blue Standing Exercises Other Standing Exercises: Serratus anterior against wall 10x 5" Prone Exercises Other Prone Exercise: push up matrix 7 positions 3 reps each  Manual Therapy Manual Therapy: Massage Massage: MFR/STM focus on Lt scapular region with main focus on medial scapular region, rhomboids, serratus anterior, subscapula and lower traps  Physical Therapy Assessment and Plan PT Assessment and Plan Clinical Impression Statement: Added serratus anterior strengthening exercises following report of pain.  Therapist facilitatino to improve scapular movement.  Manual technqiues complete to reduce fascial restrictions around scapular region with main focus on serratus anterior, rhomboids and lower trapezius.  Pain free at end of session.   PT Plan: Focus of therapy on increasing pectoral mobility and strengthening of posterior scapular staqbilizers  including serratus anterior, mid and lower trapezius and rhomboids.     Goals PT Short Term Goals PT Short Term Goal 1: Patient will be able to side bend his head bilaterally to 40 degrees without pain or compensation by elevating shoulders, PT Short Term Goal 1 - Progress: Progressing toward goal PT Short Term Goal 3: Patient will be able to rotate head bilaterally 80 degrees to look over shoulder while driving.  PT Short Term Goal 3 - Progress: Progressing toward goal PT Short Term Goal 4: Patient will be able to flex and abduction bilateral UE to 160 degrees to be able to reach overhead to  high shelf PT Short Term Goal 4 - Progress: Progressing toward goal PT Short Term Goal 5: Patient will be able to horizontally abduct bilateral UE to >35 degrees indicatign improved pectoral mobility and decreased roundign forward of shoulder PT Short Term Goal 5 - Progress: Progressing toward goal PT Long Term Goals Long Term Goal 3: Patient will demosntrate a Lowr trapezius strength of 4/5 MMT to indicate improving posture.  Long Term Goal 3 Progress: Progressing toward goal  Problem List Patient Active Problem List   Diagnosis Date Noted  . Stiffness of joints, not elsewhere classified, multiple sites 12/27/2013  . Cervicalgia 12/27/2013  . Hemoglobin C (Hb-C) 04/11/2013  . Hyperlipidemia LDL goal < 70 10/19/2012  . Hypothyroid 10/19/2012  . DM 11/30/2009  . UNSPECIFIED ANEMIA 11/30/2009  . HYPERTENSION 11/30/2009  . DVT 11/30/2009  . BRONCHIECTASIS 11/30/2009  . COMPARTMENT SYNDROME UNSPECIFIED 11/30/2009  . ARTHRITIS, LEFT KNEE 09/10/2009    PT - End of Session Activity Tolerance: Patient tolerated treatment well General Behavior During Therapy: [facility] for tasks assessed/performed  GP    Juel BurrowCockerham, Arielle Eber Jo 03/17/2014, 12:13 PM

## 2014-03-20 ENCOUNTER — Ambulatory Visit (HOSPITAL_COMMUNITY): Payer: 59 | Admitting: Physical Therapy

## 2014-03-22 ENCOUNTER — Ambulatory Visit (HOSPITAL_COMMUNITY)
Admission: RE | Admit: 2014-03-22 | Discharge: 2014-03-22 | Disposition: A | Discharge status: Home / Self Care | Payer: 59 | Source: Ambulatory Visit | Attending: Family Medicine | Admitting: Family Medicine

## 2014-03-22 DIAGNOSIS — Z5189 Encounter for other specified aftercare: Secondary | ICD-10-CM | POA: Diagnosis not present

## 2014-03-22 NOTE — Evaluation (Signed)
Physical Therapy Evaluation  Patient Details  Name: Jordan Goodwin MRN: [id number] Date of Birth: [birth year]  Today's Date: 03/22/2014 Time: 0800-0845 PT Time Calculation (min): 45 min     1 MMT, Self care: [phone], TE: [phone]         Visit#: 21 of 23  Re-eval: 03/25/14 Assessment Diagnosis: Cervical Pain secondary to limited cervical spine stiffness.  Next MD Visit: [name] when done with PT  Authorization: [facility]     Past Medical History:  Past Medical History  Diagnosis Date  . Hypertension   . Diabetes mellitus   . Mixed hyperlipidemia     low HDL  . 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 DVT (deep venous thrombosis) 08/2009    left lower extremity and had a DVT inthe left posterior tibial vein  . Murmur    Past Surgical History:  Past Surgical History  Procedure Laterality Date  . Tonsillectomy  1962  . Retinal detachment surgery      x's 2  . Cataract extraction, bilateral    . 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 Knee arthroscopy  2012    left knee/torn meniscus    Subjective Symptoms/Limitations Symptoms: patient notes minor discomfort occasionally in inferior medial scapula. no pain today and notes that as long as he monitor's his posture it des not bother him Pain Assessment Currently in Pain?: No/denies Pain Score: 0-No pain  Sensation/Coordination/Flexibility/Functional Tests Functional Tests Functional Tests: FOTO: 12% limited, was 42%  limited  Assessment RUE AROM (degrees) Right Shoulder Flexion: 170 Degrees Right Shoulder ABduction: 170 Degrees Right Shoulder Horizontal ABduction: 35 Degrees RUE Strength Right Shoulder Flexion:  (4+/5) Right Shoulder ABduction:  (4+/5) Right Shoulder Horizontal ABduction:  (4+/5) Right Shoulder Horizontal ADduction: 5/5 Cervical AROM Cervical Flexion: 55 Cervical Extension: 52 Cervical - Right Side Bend: 33 Cervical - Left Side Bend: 43 Cervical -  Right Rotation: 70 Cervical - Left Rotation: 70 Cervical Strength Cervical Flexion: 5/5 Cervical Extension: 5/5 Cervical - Right Side Bend: 5/5 Cervical - Left Side Bend: 5/5 Cervical - Right Rotation: 5/5 Cervical - Left Rotation: 5/5  Exercise/Treatments Stretches Other Neck Stretches: Doorway pec stretch 3 positions 3x 20" Standing Exercises Other Standing Exercises: Overhead dumbbell matrix 5lb dumbbell 10x each  Physical Therapy Assessment and Plan PT Assessment and Plan Clinical Impression Statement: Patient has made great progress towards all goals with noted improvement in UE ROM and strength as well as improved cervical spine mobility and strength resulting in decreased pain and improved ability to tolerate driving. patient has met all long term functional goals and partly met or completely met all short term goals and indicates readiness to return to wrok and all normal ADL's withtou difficulty. Remainder of session focused on education of HEP to include daily stretching for maintenance and further improvement of ROM gains and strengthieng exercises to be performed 3x a week for imporves scapulaer stabilization and to further improve posture independently.  PT Plan: Patient discharges with HEP    Goals PT Short Term Goals PT Short Term Goal 1 - Progress: Partly met PT Short Term Goal 2 - Progress: Met PT Short Term Goal 3 - Progress: Partly met PT Short Term Goal 4 - Progress: Met PT Short Term Goal 5: Patient will be able to horizontally abduct bilateral UE to >35 degrees indicatign improved pectoral mobility and decreased roundign forward of shoulder PT Short Term Goal 5 -  Progress: Met PT Long Term Goals PT Long Term Goal 1: Patient will be able to sit > 4 hours witotut pain to be able to toelrate normal work PT Long Term Goal 1 - Progress: Met PT Long Term Goal 2: Patient will demosntrate a deep neck flexion endurance test score of >1 minute indicating improved cervical  spine stability PT Long Term Goal 2 - Progress: Met Long Term Goal 3: Patient will demosntrate a Lowr trapezius strength of 4/5 MMT to indicate improving posture.  Long Term Goal 3 Progress: Met  Problem List Patient Active Problem List   Diagnosis Date Noted  . Stiffness of joints, not elsewhere classified, multiple sites 12/27/2013  . Cervicalgia 12/27/2013  . Hemoglobin C (Hb-C) 04/11/2013  . Hyperlipidemia LDL goal < 70 10/19/2012  . Hypothyroid 10/19/2012  . DM 11/30/2009  . UNSPECIFIED ANEMIA 11/30/2009  . HYPERTENSION 11/30/2009  . DVT 11/30/2009  . BRONCHIECTASIS 11/30/2009  . COMPARTMENT SYNDROME UNSPECIFIED 11/30/2009  . ARTHRITIS, LEFT KNEE 09/10/2009    PT Plan of Care PT Home Exercise Plan: Given: UE stretches (pectoral, posterior shoulder, anterior shouler, latissimus dorsi), 3D cervical spine stretches, Overhead and below shoulder dumbell matrixes, and 4-way pick up and reach,   GP    Travon Crochet R 03/22/2014, 9:08 AM  Physician Documentation Your signature is required to indicate approval of the treatment plan as stated above.  Please sign and either send electronically or make a copy of this report for your files and return this physician signed original.   Please mark one 1.__approve of plan  2. ___approve of plan with the following conditions.   ______________________________                                                          _____________________ Physician Signature                                                                                                             Date

## 2014-03-27 ENCOUNTER — Ambulatory Visit (HOSPITAL_COMMUNITY): Payer: 59

## 2014-04-04 ENCOUNTER — Ambulatory Visit (HOSPITAL_COMMUNITY): Payer: 59

## 2014-04-11 ENCOUNTER — Ambulatory Visit (HOSPITAL_COMMUNITY): Payer: 59

## 2014-04-29 ENCOUNTER — Other Ambulatory Visit: Payer: Self-pay | Admitting: Cardiovascular Disease

## 2014-05-01 NOTE — Telephone Encounter (Signed)
Rx was sent to pharmacy electronically. 

## 2014-05-23 ENCOUNTER — Ambulatory Visit (INDEPENDENT_AMBULATORY_CARE_PROVIDER_SITE_OTHER): Payer: 59 | Admitting: Urology

## 2014-05-23 DIAGNOSIS — R972 Elevated prostate specific antigen [PSA]: Secondary | ICD-10-CM

## 2014-05-23 DIAGNOSIS — N390 Urinary tract infection, site not specified: Secondary | ICD-10-CM

## 2014-10-22 ENCOUNTER — Other Ambulatory Visit: Payer: Self-pay | Admitting: Cardiovascular Disease

## 2014-10-23 NOTE — Telephone Encounter (Signed)
Rx(s) sent to pharmacy electronically.  

## 2014-10-31 ENCOUNTER — Other Ambulatory Visit: Payer: Self-pay | Admitting: Cardiovascular Disease

## 2014-11-01 NOTE — Telephone Encounter (Signed)
Atorvastatin refilled #[id number] with 0 refills on 10/23/14

## 2014-12-12 ENCOUNTER — Other Ambulatory Visit: Payer: Self-pay | Admitting: Cardiovascular Disease

## 2014-12-13 NOTE — Telephone Encounter (Signed)
Rx(s) sent to pharmacy electronically.  

## 2015-01-20 ENCOUNTER — Other Ambulatory Visit: Payer: Self-pay | Admitting: Cardiovascular Disease

## 2015-01-22 NOTE — Telephone Encounter (Signed)
Rx(s) sent to pharmacy electronically.  

## 2015-02-26 ENCOUNTER — Telehealth: Payer: Self-pay

## 2015-02-26 ENCOUNTER — Other Ambulatory Visit: Payer: Self-pay | Admitting: Cardiovascular Disease

## 2015-02-26 NOTE — Telephone Encounter (Signed)
[phone]  PATIENT NEEDS TO SCHEDULE TCS.  HE BELIEVES LAST ONE WAS 10 YRS

## 2015-03-01 ENCOUNTER — Telehealth: Payer: Self-pay

## 2015-03-01 NOTE — Telephone Encounter (Signed)
See separate triage.  

## 2015-03-07 ENCOUNTER — Other Ambulatory Visit: Payer: Self-pay

## 2015-03-07 DIAGNOSIS — Z1211 Encounter for screening for malignant neoplasm of colon: Secondary | ICD-10-CM

## 2015-03-07 NOTE — Telephone Encounter (Signed)
Gastroenterology Pre-Procedure Review  Request Date: 03/19/2015 Requesting Physician: PT just called/   LAST COLONOSCOPY 12/09/2004 BY DR, [facility]  PATIENT REVIEW QUESTIONS: The patient responded to the following health history questions as indicated:    1. Diabetes Melitis: yes 2. Joint replacements in the past 12 months: no 3. Major health problems in the past 3 months: no 4. Has an artificial valve or MVP: no 5. Has a defibrillator: no 6. Has been advised in past to take antibiotics in advance of a procedure like teeth cleaning: no 7. Family history of colon cancer: no  8. Alcohol Use: no    MEDICATIONS & ALLERGIES:    Patient reports the following regarding taking any blood thinners:   Plavix? no Aspirin? Yes Coumadin? no  Patient confirms/reports the following medications:  Current Outpatient Prescriptions  Medication Sig Dispense Refill  . aspirin 81 MG EC tablet Take 81 mg by mouth daily. Swallow whole.    Marland Kitchen atorvastatin (LIPITOR) 20 MG tablet Take 1 tablet (20 mg total) by mouth daily. <PLEASE MAKE APPOINTMENT FOR REFILLS> 90 tablet 0  . glucosamine-chondroitin 500-400 MG tablet Take 1 tablet by mouth 2 (two) times daily.    . irbesartan-hydrochlorothiazide (AVALIDE) 300-12.5 MG per tablet Take 1 tablet by mouth daily. <PLEASE MAKE APPOINTMENT FOR REFILLS> 90 tablet 0  . levothyroxine (SYNTHROID) 88 MCG tablet Take 1 tablet (88 mcg total) by mouth daily before breakfast. (Patient taking differently: Take 100 mcg by mouth daily before breakfast. ) 90 tablet 3  . metoprolol succinate (TOPROL-XL) 50 MG 24 hr tablet Take 1 tablet (50 mg total) by mouth daily. PATIENT NEEDS TO CONTACT OFFICE FOR ADDITIONAL REFILLS 90 tablet 1  . sitaGLIPtin (JANUVIA) 100 MG tablet Take 100 mg by mouth daily.    . niacin (NIASPAN) 1000 MG CR tablet TAKE 1 TABLET (1,000 MG TOTAL) BY MOUTH AT BEDTIME. (Patient not taking: Reported on 03/01/2015) 100 tablet 0   No current facility-administered  medications for this visit.    Patient confirms/reports the following allergies:  Allergies  Allergen Reactions  . Warfarin And Related     Compartment syndrome    No orders of the defined types were placed in this encounter.    AUTHORIZATION INFORMATION Primary Insurance:   ID #:   Group #:  Pre-Cert / Auth required: Pre-Cert / Auth #:   Secondary Insurance:   ID #:  Group #:  Pre-Cert / Auth required:  Pre-Cert / Auth #:   SCHEDULE INFORMATION: Procedure has been scheduled as follows:  Date:   03/19/2015         Time: 9:30 am  Location: [facility] Short Stay  This Gastroenterology Pre-Precedure Review Form is being routed to the following provider(s): R. Roetta Sessions, MD

## 2015-03-07 NOTE — Telephone Encounter (Signed)
OK to schedule.  Day of prep: Januvia 1/2 dose.

## 2015-03-08 ENCOUNTER — Telehealth: Payer: Self-pay

## 2015-03-08 MED ORDER — PEG 3350-KCL-NA BICARB-NACL 420 G PO SOLR: 4000.0000 mL | ORAL | Status: DC

## 2015-03-08 NOTE — Telephone Encounter (Signed)
Rx sent to the pharmacy and instructions mailed to pt.  

## 2015-03-08 NOTE — Telephone Encounter (Signed)
I called [employer] @ [phone] and spoke to [facility] who said PA is required for the screening colonoscopy as outpatient. Reference # [id number].

## 2015-03-10 ENCOUNTER — Other Ambulatory Visit: Payer: Self-pay | Admitting: Cardiovascular Disease

## 2015-03-12 NOTE — Telephone Encounter (Signed)
REFILL 

## 2015-03-15 NOTE — Telephone Encounter (Signed)
PA approved for colonoscopy and scanned into epic.

## 2015-03-19 ENCOUNTER — Encounter (HOSPITAL_COMMUNITY)
Admission: RE | Disposition: A | Discharge status: Home / Self Care | Payer: Self-pay | Source: Ambulatory Visit | Attending: Internal Medicine

## 2015-03-19 ENCOUNTER — Encounter (HOSPITAL_COMMUNITY): Payer: Self-pay | Admitting: *Deleted

## 2015-03-19 ENCOUNTER — Ambulatory Visit (HOSPITAL_COMMUNITY)
Admission: RE | Admit: 2015-03-19 | Discharge: 2015-03-19 | Disposition: A | Discharge status: Home / Self Care | Payer: 59 | Source: Ambulatory Visit | Attending: Internal Medicine | Admitting: Internal Medicine

## 2015-03-19 DIAGNOSIS — Z79899 Other long term (current) drug therapy: Secondary | ICD-10-CM | POA: Insufficient documentation

## 2015-03-19 DIAGNOSIS — K5730 Diverticulosis of large intestine without perforation or abscess without bleeding: Secondary | ICD-10-CM | POA: Diagnosis not present

## 2015-03-19 DIAGNOSIS — Z1211 Encounter for screening for malignant neoplasm of colon: Secondary | ICD-10-CM | POA: Diagnosis not present

## 2015-03-19 DIAGNOSIS — Z86718 Personal history of other venous thrombosis and embolism: Secondary | ICD-10-CM | POA: Diagnosis not present

## 2015-03-19 DIAGNOSIS — E119 Type 2 diabetes mellitus without complications: Secondary | ICD-10-CM | POA: Diagnosis not present

## 2015-03-19 DIAGNOSIS — I10 Essential (primary) hypertension: Secondary | ICD-10-CM | POA: Insufficient documentation

## 2015-03-19 DIAGNOSIS — E782 Mixed hyperlipidemia: Secondary | ICD-10-CM | POA: Diagnosis not present

## 2015-03-19 DIAGNOSIS — Z7982 Long term (current) use of aspirin: Secondary | ICD-10-CM | POA: Insufficient documentation

## 2015-03-19 HISTORY — DX: Other complications of anesthesia, initial encounter: T88.59XA

## 2015-03-19 HISTORY — PX: COLONOSCOPY: SHX5424

## 2015-03-19 HISTORY — DX: Adverse effect of unspecified anesthetic, initial encounter: T41.45XA

## 2015-03-19 LAB — GLUCOSE, CAPILLARY: Glucose-Capillary: 140 mg/dL — ABNORMAL HIGH (ref 65–99)

## 2015-03-19 SURGERY — COLONOSCOPY
Anesthesia: Moderate Sedation

## 2015-03-19 MED ORDER — MEPERIDINE HCL 100 MG/ML IJ SOLN
INTRAMUSCULAR | Status: DC | PRN
  Administered 2015-03-19: 25 mg via INTRAVENOUS
  Administered 2015-03-19: 50 mg via INTRAVENOUS

## 2015-03-19 MED ORDER — MIDAZOLAM HCL 5 MG/5ML IJ SOLN
INTRAMUSCULAR | Status: AC
  Filled 2015-03-19: qty 10

## 2015-03-19 MED ORDER — MIDAZOLAM HCL 5 MG/5ML IJ SOLN
INTRAMUSCULAR | Status: DC | PRN
Start: 2015-03-19 — End: 2015-03-19
  Administered 2015-03-19: 1 mg via INTRAVENOUS
  Administered 2015-03-19 (×2): 2 mg via INTRAVENOUS

## 2015-03-19 MED ORDER — ONDANSETRON HCL 4 MG/2ML IJ SOLN
INTRAMUSCULAR | Status: DC | PRN
Start: 2015-03-19 — End: 2015-03-19
  Administered 2015-03-19: 4 mg via INTRAVENOUS

## 2015-03-19 MED ORDER — MEPERIDINE HCL 100 MG/ML IJ SOLN
INTRAMUSCULAR | Status: AC
  Filled 2015-03-19: qty 2

## 2015-03-19 MED ORDER — SIMETHICONE 40 MG/0.6ML PO SUSP
ORAL | Status: DC | PRN
  Administered 2015-03-19: 09:00:00

## 2015-03-19 MED ORDER — SODIUM CHLORIDE 0.9 % IV SOLN
INTRAVENOUS | Status: DC
  Administered 2015-03-19: 1000 mL via INTRAVENOUS

## 2015-03-19 MED ORDER — ONDANSETRON HCL 4 MG/2ML IJ SOLN
INTRAMUSCULAR | Status: AC
  Filled 2015-03-19: qty 2

## 2015-03-19 NOTE — Discharge Instructions (Signed)
°Colonoscopy °Discharge Instructions ° °Read the instructions outlined below and refer to this sheet in the next few weeks. These discharge instructions provide you with general information on caring for yourself after you leave the hospital. Your doctor may also give you specific instructions. While your treatment has been planned according to the most current medical practices available, unavoidable complications occasionally occur. If you have any problems or questions after discharge, call Dr. Rourk at [phone]. °ACTIVITY °· You may resume your regular activity, but move at a slower pace for the next 24 hours.  °· Take frequent rest periods for the next 24 hours.  °· Walking will help get rid of the air and reduce the bloated feeling in your belly (abdomen).  °· No driving for 24 hours (because of the medicine (anesthesia) used during the test).   °· Do not sign any important legal documents or operate any machinery for 24 hours (because of the anesthesia used during the test).  °NUTRITION °· Drink plenty of fluids.  °· You may resume your normal diet as instructed by your doctor.  °· Begin with a light meal and progress to your normal diet. Heavy or fried foods are harder to digest and may make you feel sick to your stomach (nauseated).  °· Avoid alcoholic beverages for 24 hours or as instructed.  °MEDICATIONS °· You may resume your normal medications unless your doctor tells you otherwise.  °WHAT YOU CAN EXPECT TODAY °· Some feelings of bloating in the abdomen.  °· Passage of more gas than usual.  °· Spotting of blood in your stool or on the toilet paper.  °IF YOU HAD POLYPS REMOVED DURING THE COLONOSCOPY: °· No aspirin products for 7 days or as instructed.  °· No alcohol for 7 days or as instructed.  °· Eat a soft diet for the next 24 hours.  °FINDING OUT THE RESULTS OF YOUR TEST °Not all test results are available during your visit. If your test results are not back during the visit, make an appointment  with your caregiver to find out the results. Do not assume everything is normal if you have not heard from your caregiver or the medical facility. It is important for you to follow up on all of your test results.  °SEEK IMMEDIATE MEDICAL ATTENTION IF: °· You have more than a spotting of blood in your stool.  °· Your belly is swollen (abdominal distention).  °· You are nauseated or vomiting.  °· You have a temperature over 101.  °· You have abdominal pain or discomfort that is severe or gets worse throughout the day.  ° ° ° °Diverticulosis information provided ° °Repeat screening colonoscopy in 10 years ° °Diverticulosis °Diverticulosis is the condition that develops when small pouches (diverticula) form in the wall of your colon. Your colon, or large intestine, is where water is absorbed and stool is formed. The pouches form when the inside layer of your colon pushes through weak spots in the outer layers of your colon. °CAUSES  °No one knows exactly what causes diverticulosis. °RISK FACTORS °· Being older than 50. Your risk for this condition increases with age. Diverticulosis is rare in people younger than 40 years. By age 80, almost everyone has it. °· Eating a low-fiber diet. °· Being frequently constipated. °· Being overweight. °· Not getting enough exercise. °· Smoking. °· Taking over-the-counter pain medicines, like aspirin and ibuprofen. °SYMPTOMS  °Most people with diverticulosis do not have symptoms. °DIAGNOSIS  °Because diverticulosis often has no   symptoms, health care providers often discover the condition during an exam for other colon problems. In many cases, a health care provider will diagnose diverticulosis while using a flexible scope to examine the colon (colonoscopy). °TREATMENT  °If you have never developed an infection related to diverticulosis, you may not need treatment. If you have had an infection before, treatment may include: °· Eating more fruits, vegetables, and grains. °· Taking a  fiber supplement. °· Taking a live bacteria supplement (probiotic). °· Taking medicine to relax your colon. °HOME CARE INSTRUCTIONS  °· Drink at least 6-8 glasses of water each day to prevent constipation. °· Try not to strain when you have a bowel movement. °· Keep all follow-up appointments. °If you have had an infection before:  °· Increase the fiber in your diet as directed by your health care provider or dietitian. °· Take a dietary fiber supplement if your health care provider approves. °· Only take medicines as directed by your health care provider. °SEEK MEDICAL CARE IF:  °· You have abdominal pain. °· You have bloating. °· You have cramps. °· You have not gone to the bathroom in 3 days. °SEEK IMMEDIATE MEDICAL CARE IF:  °· Your pain gets worse. °· Your bloating becomes very bad. °· You have a fever or chills, and your symptoms suddenly get worse. °· You begin vomiting. °· You have bowel movements that are bloody or black. °MAKE SURE YOU: °· Understand these instructions. °· Will watch your condition. °· Will get help right away if you are not doing well or get worse. °  °This information is not intended to replace advice given to you by your health care provider. Make sure you discuss any questions you have with your health care provider. °  °Document Released: 02/14/2004 Document Revised: 05/24/2013 Document Reviewed: 04/13/2013 °Elsevier Interactive Patient Education ©2016 Elsevier Inc. ° °

## 2015-03-19 NOTE — Op Note (Signed)
[facility] [address] [place], [id number]   COLONOSCOPY PROCEDURE REPORT  PATIENT: Jordan Goodwin, Dedrick L  MR#: [id number] BIRTHDATE: 1950/08/02 , 64  yrs. old GENDER: male ENDOSCOPIST: R.  Roetta SessionsMichael Lewie Deman, MD FACP [facility] REFERRED XB:JYNWGNFABY:Samantha Laurence AlyJackson, P.A. PROCEDURE DATE:  03/19/2015 PROCEDURE:   Colonoscopy, screening INDICATIONS:Average risk colorectal cancer screening examination. MEDICATIONS: Versed 5 mg IV and Demerol 75 mg IV in divided doses. Zofran 4 mg IV. ASA CLASS:       Class II  CONSENT: The risks, benefits, alternatives and imponderables including but not limited to bleeding, perforation as well as the possibility of a missed lesion have been reviewed.  The potential for biopsy, lesion removal, etc. have also been discussed. Questions have been answered.  All parties agreeable.  Please see the history and physical in the medical record for more information.  DESCRIPTION OF PROCEDURE:   After the risks benefits and alternatives of the procedure were thoroughly explained, informed consent was obtained.  The digital rectal exam revealed no abnormalities of the rectum.   The EC-[id number] ([id number]  endoscope was introduced through the anus and advanced to the cecum, which was identified by both the appendix and ileocecal valve. No adverse events experienced.   The quality of the prep was adequate  The instrument was then slowly withdrawn as the colon was fully examined. Estimated blood loss is zero unless otherwise noted in this procedure report.      COLON FINDINGS: Normal-appearing rectal mucosa.  Fairly extensive left-sided diverticula; the remainder of the colonic mucosa appeared normal.  Retroflexion was performed. .  Patient did have some internal hemorrhoids  Withdrawal time=8 minutes 0 seconds.  The scope was withdrawn and the procedure completed. COMPLICATIONS: There were no immediate complications.  ENDOSCOPIC IMPRESSION: Colonic  diverticulosis  RECOMMENDATIONS: Return for more screening colonoscopy in 10 years  eSigned:  R. Roetta SessionsMichael Teryn Gust, MD Jerrel IvoryFACP [facility] 03/19/2015 9:36 AM   cc:  CPT CODES: ICD CODES:  The ICD and CPT codes recommended by this software are interpretations from the data that the clinical staff has captured with the software.  The verification of the translation of this report to the ICD and CPT codes and modifiers is the sole responsibility of the health care institution and practicing physician where this report was generated.  [employer] will not be held responsible for the validity of the ICD and CPT codes included on this report.  AMA assumes no liability for data contained or not contained herein. CPT is a Publishing rights managerregistered trademark of the [employer].  PATIENT NAME:  Jordan Goodwin, Sahib L MR#: [id number]

## 2015-03-19 NOTE — H&P (Signed)
 @LOGO @   Primary Care Physician:  Colette RibasGOLDING, Lavell CABOT, MD Primary Gastroenterologist:  Dr. Jena Gaussourk Pre-Procedure History & Physical: HPI:  Jordan Goodwin is a 64 y.o. male is here for a screening colonoscopy. No bowel symptoms. No family history of colon cancer in any first-degree relatives.  Negative colonoscopy 10 years ago.  Past Medical History  Diagnosis Date  . Hypertension   . Diabetes mellitus   . Mixed hyperlipidemia     low HDL  . 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 DVT (deep venous thrombosis) (HCC) 08/2009    left lower extremity and had a DVT inthe left posterior tibial vein  . Murmur   . Complication of anesthesia     As a child, woke up "yelling."    Past Surgical History  Procedure Laterality Date  . Tonsillectomy  1962  . Retinal detachment surgery      x's 2  . Cataract extraction, bilateral    . 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 Knee arthroscopy  2012    left knee/torn meniscus    Prior to Admission medications   Medication Sig Start Date End Date Taking? Authorizing Provider  aspirin 81 MG EC tablet Take 81 mg by mouth daily. Swallow whole.   Yes Historical Provider, MD  atorvastatin (LIPITOR) 20 MG tablet Take 1 tablet (20 mg total) by mouth daily. <PLEASE MAKE APPOINTMENT FOR REFILLS> 10/23/14  Yes Lennette Biharihomas A Kelly, MD  glucosamine-chondroitin 500-400 MG tablet Take 1 tablet by mouth 2 (two) times daily.   Yes Historical Provider, MD  irbesartan-hydrochlorothiazide (AVALIDE) 300-12.5 MG tablet TAKE 1 TABLET BY MOUTH DAILY. <PLEASE MAKE APPOINTMENT FOR REFILLS> 03/12/15  Yes Lennette Biharihomas A Kelly, MD  levothyroxine (SYNTHROID, LEVOTHROID) 100 MCG tablet TAKE 1 TABLET BY MOUTH EVERY DAY (NEEDS OFFICE VISIT, AND BLOOD WORK) 12/18/14  Yes Historical Provider, MD  metoprolol succinate (TOPROL-XL) 50 MG 24 hr tablet Take 1 tablet (50 mg total) by mouth daily. PATIENT NEEDS TO CONTACT OFFICE FOR ADDITIONAL REFILLS  01/22/15  Yes Lennette Biharihomas A Kelly, MD  polyethylene glycol-electrolytes (TRILYTE) 420 G solution Take 4,000 mLs by mouth as directed. 03/08/15  Yes Corbin Adeobert M Aven Christen, MD  sitaGLIPtin (JANUVIA) 100 MG tablet Take 100 mg by mouth daily.   Yes Historical Provider, MD  niacin (NIASPAN) 1000 MG CR tablet TAKE 1 TABLET (1,000 MG TOTAL) BY MOUTH AT BEDTIME. Patient not taking: Reported on 03/01/2015 02/26/15   Lennette Biharihomas A Kelly, MD    Allergies as of 03/07/2015 - Review Complete 03/01/2015  Allergen Reaction Noted  . Warfarin and related  04/11/2013    Family History  Problem Relation Age of Onset  . Cancer Father [id number]    deceased  . Heart disease Maternal Grandmother   . Heart disease Paternal Grandfather     Social History   Social History  . Marital Status: Married    Spouse Name: N/A  . Number of Children: N/A  . Years of Education: N/A   Occupational History  . Not on file.   Social History Main Topics  . Smoking status: Never Smoker   . Smokeless tobacco: Never Used  . Alcohol Use: Yes     Comment: occasionally drinks beer,whiskey  . Drug Use: No  . Sexual Activity: Not on file   Other Topics Concern  . Not on file   Social History Narrative   Married lives with wife   Has  1 son   Has 1 grandson    Review of Systems: See HPI, otherwise negative ROS  Physical Exam: Pulse 88  Temp(Src) 97.9 F (36.6 C) (Oral)  Resp 28  Ht  (1.905 m)  Wt 220 lb (99.791 kg)  BMI 27.50 kg/m2  SpO2 22% General:   Alert,  Well-developed, well-nourished, pleasant and cooperative in NAD Head:  Normocephalic and atraumatic. Neck:  Supple; no masses or thyromegaly. Lungs:  Clear throughout to auscultation.   No wheezes, crackles, or rhonchi. No acute distress. Heart:  Regular rate and rhythm; no murmurs, clicks, rubs,  or gallops. Abdomen:  Soft, nontender and nondistended. No masses, hepatosplenomegaly or hernias noted. Normal bowel sounds, without guarding, and without rebound.   Msk:   Symmetrical without gross deformities. Normal posture. Pulses:  Normal pulses noted. Extremities:  Without clubbing or edema.  Impression/Plan: Jordan Goodwin is now here to undergo a screening colonoscopy.  Average or screening examination. Risks, benefits, limitations, imponderables and alternatives regarding colonoscopy have been reviewed with the patient. Questions have been answered. All parties agreeable.     Notice:  This dictation was prepared with Dragon dictation along with smaller phrase technology. Any transcriptional errors that result from this process are unintentional and may not be corrected upon review.

## 2015-03-22 ENCOUNTER — Encounter (HOSPITAL_COMMUNITY): Payer: Self-pay | Admitting: Internal Medicine

## 2015-04-05 ENCOUNTER — Ambulatory Visit (INDEPENDENT_AMBULATORY_CARE_PROVIDER_SITE_OTHER): Payer: 59 | Admitting: Cardiovascular Disease

## 2015-04-05 VITALS — BP 138/84 | HR 59 | Ht 75.0 in | Wt 233.2 lb

## 2015-04-05 DIAGNOSIS — I10 Essential (primary) hypertension: Secondary | ICD-10-CM | POA: Diagnosis not present

## 2015-04-05 DIAGNOSIS — E038 Other specified hypothyroidism: Secondary | ICD-10-CM

## 2015-04-05 DIAGNOSIS — E785 Hyperlipidemia, unspecified: Secondary | ICD-10-CM

## 2015-04-05 DIAGNOSIS — D582 Other hemoglobinopathies: Secondary | ICD-10-CM

## 2015-04-05 NOTE — Patient Instructions (Signed)
Your physician wants you to follow-up in: 1 year or sooner if needed. You will receive a reminder letter in the mail two months in advance. If you don't receive a letter, please call our office to schedule the follow-up appointment.   If you need a refill on your cardiac medications before your next appointment, please call your pharmacy.   

## 2015-04-06 ENCOUNTER — Encounter: Payer: Self-pay | Admitting: Cardiovascular Disease

## 2015-04-06 NOTE — Progress Notes (Signed)
Patient ID: Jordan Goodwin, male   DOB: [birth year], 64 y.o.   MRN: [id number]                                                                                                                                                 HPI: Jordan Goodwin is a 64 y.o. male who presents to the office for an 18 month followup evaluation.  Jordan Goodwin has a history of left lower extremity DVT in April 2011 involving the left posterior tibial vein. He was treated with Lovenox and Coumadin and subsequently developed a calf hematoma leading to compartment syndrome requiring fasciotomy on 09/18/2009 and had a spontaneous pulmonary hemorrhage 09/22/2009. 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 He has history of diabetes mellitus and remotely he has had elevated PSAs felt  to be due to infection rather than cancer. Over the past year, he was taken off his Benicar 20 mg and this was substituted by his insurance company  with losartan 50 mg  titrated to 100 mg daily. Subsequently, this still did not control his blood pressure and he was switched to research and hydrochlorothiazide 300/12.5 mg.    An echo Doppler study in March 2012 with suggested mild to moderate aortic insufficiency.  Normal systolic function.  There was mild mitral regurgitation, mild tricuspid regurgitation.  Pulmonary pressure was normal.    He has a history of hypothyroidism and recently has been on Synthroid at 50 mcg.  He also has a history of hyperlipidemia, in addition to type 2 diabetes mellitus.  Laboratory last year revealed a total cholesterol of 134, HDL 43, triglycerides 48, LDL 81.  However, LDL particle number was increased at 1299.     Presently, Jordan Goodwin has continued to feel well.  He is still working as an Chief Financial Officer at [employer].  He continues to exercise at least 3 days per week at a gym where he walks at least 35 minutes on a  treadmill and lifts weights.  He denies shortness of breath.  He denies chest pain.  He does admit to fatigue.  He denies palpitations.  He presents for evaluation.   Past Medical History  Diagnosis Date  . Hypertension   . Diabetes mellitus   . Mixed hyperlipidemia     low HDL  . 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 DVT (deep venous thrombosis) 08/2009    left lower extremity and had a DVT inthe left posterior tibial vein  . Murmur     Past Surgical History  Procedure Laterality Date  . Tonsillectomy  1962  . Retinal detachment surgery      x's 2  . Cataract extraction, bilateral    . Leg surgery  2012  LLE/compartment syndrome after blood clot  . Knee arthroscopy  2012    left knee/torn meniscus    Allergies  Allergen Reactions  . Warfarin And Related     Compartment syndrome    Current Outpatient Prescriptions  Medication Sig Dispense Refill  . aspirin 81 MG EC tablet Take 81 mg by mouth daily. Swallow whole.      Marland Kitchen atorvastatin (LIPITOR) 20 MG tablet Take 20 mg by mouth daily.      Marland Kitchen glucosamine-chondroitin 500-400 MG tablet Take 1 tablet by mouth 2 (two) times daily.      . irbesartan (AVAPRO) 300 MG tablet Take 1 tablet (300 mg total) by mouth at bedtime.  30 tablet  1  . levothyroxine (SYNTHROID, LEVOTHROID) 50 MCG tablet Take 50 mcg by mouth daily before breakfast.      . metoprolol succinate (TOPROL-XL) 50 MG 24 hr tablet Take 50 mg by mouth daily. Take with or immediately following a meal.      . niacin (NIASPAN) 1000 MG CR tablet TAKE 1 TABLET BY MOUTH AT BEDTIME  90 tablet  3  . sitaGLIPtin (JANUVIA) 100 MG tablet Take 100 mg by mouth daily.       No current facility-administered medications for this visit.   Socially he is married and has one child age 5. He exercises approximately 3-4 days per week. There is no tobacco history. He works as an Chief Financial Officer for [employer].   ROS General: Positive for fatigue; No fevers, chills, or  night sweats;  HEENT: Negative; No changes in vision or hearing, sinus congestion, difficulty swallowing Pulmonary: Negative; No cough, wheezing, shortness of breath, hemoptysis Cardiovascular: Negative; No chest pain, presyncope, syncope, palpatations GI: Negative; No nausea, vomiting, diarrhea, or abdominal pain GU: Negative; No dysuria, hematuria, or difficulty voiding Musculoskeletal: Negative; no myalgias, joint pain, or weakness Hematologic/Oncology: Negative; no easy bruising, bleeding Endocrine: Positive for diabetes , and hypothyroidism; no heat/cold intolerance;  Neuro: Negative; no changes in balance, headaches Skin: Negative; No rashes or skin lesions Psychiatric: Negative; No behavioral problems, depression Sleep: Negative; No snoring, daytime sleepiness, hypersomnolence, bruxism, restless legs, hypnogognic hallucinations, no cataplexy Other comprehensive 14 point system review is negative.   PE  BP 138/84 mmHg  Pulse 59  Ht '6\' 3"'  (1.905 m)  Wt 233 lb 3.2 oz (105.779 kg)  BMI 29.15 kg/m2   Wt Readings from Last 3 Encounters:  04/05/15 233 lb 3.2 oz (105.779 kg)  03/19/15 220 lb (99.791 kg)  10/17/13 225 lb 11.2 oz (102.377 kg)   General: Alert, oriented, no distress.  HEENT: Normocephalic, atraumatic. Pupils round and reactive; sclera anicteric;  Mouth/Parynx benign; Mallinpatti scale 3 Neck: No JVD, no carotid bruits; normal carotid upstroke Lungs: clear to ausculatation and percussion; no wheezing or rales Chest wall: Nontender to Heart: RRR, s1 s2 normal 1-0/2 diastolic murmur in the aortic position suggestive of AR.  No S3 or S4 gallop.  No rubs, thrills or heaves. Abdomen: soft, nontender; no hepatosplenomehaly, BS+; abdominal aorta nontender and not dilated by palpation. Back: No CVA tenderness Pulses 2+ Extremities: no clubbinbg cyanosis or edema, Homan's sign negative  Neurologic: grossly nonfocal Psychological: Normal affect and mood  ECG  (independently read by me): Sinus bradycardia 59 bpm.  First degree AV block with a PR interval 208 ms.  Nonspecific T changes.  May 2015 ECG : Sinus rhythm at 59 beats per minute.  Borderline first-degree block with PR interval 206 ms.  No significant ST changes.  Prior ECG:  Sinus bradycardia 55 beats per minute.  Nonspecific T changes. Normal intervals.  LABS:  BMP Latest Ref Rng 10/11/2013 05/02/2013 11/09/2012  Glucose 70 - 99 mg/dL 145(H) 133(H) 150(H)  BUN 6 - 23 mg/dL '14 19 13  ' Creatinine 0.50 - 1.35 mg/dL 0.90 0.99 1.00  Sodium 135 - 145 mEq/L 141 140 141  Potassium 3.5 - 5.3 mEq/L 4.0 4.4 4.1  Chloride 96 - 112 mEq/L 105 105 104  CO2 19 - 32 mEq/L '28 25 26  ' Calcium 8.4 - 10.5 mg/dL 9.1 9.7 9.2   Hepatic Function Latest Ref Rng 10/11/2013 11/14/2009 09/23/2009  Total Protein 6.0 - 8.3 g/dL 6.9 7.0 6.2  Albumin 3.5 - 5.2 g/dL 4.4 4.1 2.7(L)  AST 0 - 37 U/L 20 23 40(H)  ALT 0 - 53 U/L 17 19 66(H)  Alk Phosphatase 39 - 117 U/L 46 52 63  Total Bilirubin 0.2 - 1.2 mg/dL 0.6 0.8 0.5   CBC Latest Ref Rng 10/11/2013 02/11/2010 11/14/2009  WBC 4.0 - 10.5 K/uL 4.5 5.8 4.8  Hemoglobin 13.0 - 17.0 g/dL 13.1 13.5 12.2(L)  Hematocrit 39.0 - 52.0 % 38.9(L) 41.6 37.3(L)  Platelets 150 - 400 K/uL 183 183 206   Lab Results  Component Value Date   MCV 68.7* 10/11/2013   MCV 71.4* 02/11/2010   MCV 73.5* 11/14/2009   Lab Results  Component Value Date   TSH 6.283* 11/14/2013   Lab Results  Component Value Date   HGBA1C 6.7* 10/11/2013   Lipid Panel     Component Value Date/Time   CHOL 134 10/11/2013 1309   TRIG 48 10/11/2013 1309   HDL 43 10/11/2013 1309   LDLCALC 81 10/11/2013 1309    RADIOLOGY: No results found.    ASSESSMENT AND PLAN:  Jordan Goodwin is a 64 year old gentleman who has a history of DVT and is doing well with reference to this.  There are no recurrent symptoms.  He denies recurrent leg swelling.  His blood pressure today is controlled on his current dose of  irbesartan hydrochlorothiazide 300/12.5 and Toprol-XL 50 mg.  Last year his TSH was elevated at 6.644 on his current dose of Synthroid 50 mcg, suggestive of residual, hypothyroidism, and I have recommended he increase his Synthroid to 75 mcg. he is now on 100 g. He is tolerating atorvastatin 20 mg and Niaspan for his mixed hyperlipidemia.  He is on Januvia for his diabetes and  hemoglobin A1c was 6.7 last year.  He has microcytic indices and several years ago.  I scheduled him for hemoglobin electrophoresis which revealed hemoglobin C.  Cardiac exam does suggest a 1-2/6 aortic insufficiency murmur.  He never had a follow-up echo Doppler study.  He sees Dr. Hilma Favors for his primary care.  We will try to obtain recent laboratory.  If recently completed to make certain he does not require any medication adjustment.  He will continue current therapy.  As long as he remains stable I'll see him in one year for reevaluation.   Troy Sine, MD  04/06/2015  5:58 PM

## 2015-05-22 ENCOUNTER — Ambulatory Visit (INDEPENDENT_AMBULATORY_CARE_PROVIDER_SITE_OTHER): Payer: 59 | Admitting: Urology

## 2015-05-22 DIAGNOSIS — R972 Elevated prostate specific antigen [PSA]: Secondary | ICD-10-CM

## 2015-06-09 ENCOUNTER — Other Ambulatory Visit: Payer: Self-pay | Admitting: Cardiovascular Disease

## 2015-06-11 NOTE — Telephone Encounter (Signed)
Rx request sent to pharmacy.  

## 2015-07-19 ENCOUNTER — Other Ambulatory Visit: Payer: Self-pay | Admitting: Cardiovascular Disease

## 2015-07-19 NOTE — Telephone Encounter (Signed)
Rx request sent to pharmacy.  

## 2016-05-14 ENCOUNTER — Other Ambulatory Visit: Payer: Self-pay | Admitting: Cardiovascular Disease

## 2016-05-20 ENCOUNTER — Ambulatory Visit (INDEPENDENT_AMBULATORY_CARE_PROVIDER_SITE_OTHER): Payer: 59 | Admitting: Urology

## 2016-05-20 ENCOUNTER — Other Ambulatory Visit (HOSPITAL_COMMUNITY)
Admission: RE | Admit: 2016-05-20 | Discharge: 2016-05-20 | Disposition: A | Discharge status: Home / Self Care | Payer: 59 | Source: Other Acute Inpatient Hospital | Attending: Urology | Admitting: Urology

## 2016-05-20 DIAGNOSIS — R311 Benign essential microscopic hematuria: Secondary | ICD-10-CM | POA: Diagnosis not present

## 2016-05-20 DIAGNOSIS — R972 Elevated prostate specific antigen [PSA]: Secondary | ICD-10-CM

## 2016-07-03 ENCOUNTER — Other Ambulatory Visit: Payer: Self-pay | Admitting: Cardiovascular Disease

## 2016-08-04 ENCOUNTER — Ambulatory Visit (INDEPENDENT_AMBULATORY_CARE_PROVIDER_SITE_OTHER): Payer: 59 | Admitting: Cardiovascular Disease

## 2016-08-04 ENCOUNTER — Encounter: Payer: Self-pay | Admitting: Cardiovascular Disease

## 2016-08-04 VITALS — BP 112/74 | HR 56 | Ht 75.0 in | Wt 232.0 lb

## 2016-08-04 DIAGNOSIS — E119 Type 2 diabetes mellitus without complications: Secondary | ICD-10-CM

## 2016-08-04 DIAGNOSIS — Z794 Long term (current) use of insulin: Secondary | ICD-10-CM | POA: Diagnosis not present

## 2016-08-04 DIAGNOSIS — I10 Essential (primary) hypertension: Secondary | ICD-10-CM

## 2016-08-04 DIAGNOSIS — E785 Hyperlipidemia, unspecified: Secondary | ICD-10-CM | POA: Diagnosis not present

## 2016-08-04 DIAGNOSIS — Z86718 Personal history of other venous thrombosis and embolism: Secondary | ICD-10-CM

## 2016-08-04 DIAGNOSIS — D582 Other hemoglobinopathies: Secondary | ICD-10-CM | POA: Diagnosis not present

## 2016-08-04 DIAGNOSIS — E038 Other specified hypothyroidism: Secondary | ICD-10-CM | POA: Diagnosis not present

## 2016-08-04 NOTE — Progress Notes (Signed)
Patient ID: Jordan Goodwin, male   DOB: [birth year], 66 y.o.   MRN: [id number]                                                                                                                                                 HPI: Jordan Goodwin is a 66 y.o. male who presents to the office for an 16 month followup evaluation.  Mr. Baig has a history of left lower extremity DVT in April 2011 involving the left posterior tibial vein. He was treated with Lovenox and Coumadin and subsequently developed a calf hematoma leading to compartment syndrome requiring fasciotomy on 09/18/2009 and had a spontaneous pulmonary hemorrhage 09/22/2009. 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 He has history of diabetes mellitus and remotely he has had elevated PSAs felt  to be due to infection rather than cancer. Over the past year, he was taken off his Benicar 20 mg and this was substituted by his insurance company  with losartan 50 mg  titrated to 100 mg daily. Subsequently, this still did not control his blood pressure and he was switched to research and hydrochlorothiazide 300/12.5 mg.    An echo Doppler study in March 2012 with suggested mild to moderate aortic insufficiency.  Normal systolic function.  There was mild mitral regurgitation, mild tricuspid regurgitation.  Pulmonary pressure was normal.    He has a history of hypothyroidism and recently has been on Synthroid at 50 mcg.  He also has a history of hyperlipidemia, in addition to type 2 diabetes mellitus.  Laboratory last year revealed a total cholesterol of 134, HDL 43, triglycerides 48, LDL 81.  However, LDL particle number was increased at 1299.     Since I last saw him Mr. Vanrossum has continued to feel well.  He is still working as an Chief Financial Officer at [employer].  He continues to exercise at least 3 days per week at a gym where he walks at least 35 minutes  on a treadmill and lifts weights.  He denies shortness of breath.  He denies chest pain.  He denies palpitations. He denies any leg swelling.  He presents for evaluation.   Past Medical History  Diagnosis Date  . Hypertension   . Diabetes mellitus   . Mixed hyperlipidemia     low HDL  . 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 DVT (deep venous thrombosis) 08/2009    left lower extremity and had a DVT inthe left posterior tibial vein  . Murmur     Past Surgical History  Procedure Laterality Date  . Tonsillectomy  1962  . Retinal detachment surgery      x's 2  . Cataract extraction, bilateral    . 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 Knee arthroscopy  2012    left knee/torn meniscus    Allergies  Allergen Reactions  . Warfarin And Related     Compartment syndrome    Current Outpatient Prescriptions  Medication Sig Dispense Refill  . aspirin 81 MG EC tablet Take 81 mg by mouth daily. Swallow whole.      Marland Kitchen atorvastatin (LIPITOR) 20 MG tablet Take 20 mg by mouth daily.      Marland Kitchen glucosamine-chondroitin 500-400 MG tablet Take 1 tablet by mouth 2 (two) times daily.      . irbesartan (AVAPRO) 300 MG tablet Take 1 tablet (300 mg total) by mouth at bedtime.  30 tablet  1  . levothyroxine (SYNTHROID, LEVOTHROID) 50 MCG tablet Take 50 mcg by mouth daily before breakfast.      . metoprolol succinate (TOPROL-XL) 50 MG 24 hr tablet Take 50 mg by mouth daily. Take with or immediately following a meal.      . niacin (NIASPAN) 1000 MG CR tablet TAKE 1 TABLET BY MOUTH AT BEDTIME  90 tablet  3  . sitaGLIPtin (JANUVIA) 100 MG tablet Take 100 mg by mouth daily.       No current facility-administered medications for this visit.   Socially he is married and has one child age 64. He exercises approximately 3-4 days per week. There is no tobacco history. He works as an Chief Financial Officer for [employer].   ROS General: Positive for fatigue; No fevers, chills,  or night sweats;  HEENT: Negative; No changes in vision or hearing, sinus congestion, difficulty swallowing Pulmonary: Negative; No cough, wheezing, shortness of breath, hemoptysis Cardiovascular: Negative; No chest pain, presyncope, syncope, palpatations GI: Negative; No nausea, vomiting, diarrhea, or abdominal pain GU: Negative; No dysuria, hematuria, or difficulty voiding Musculoskeletal: Negative; no myalgias, joint pain, or weakness Hematologic/Oncology: Negative; no easy bruising, bleeding Endocrine: Positive for diabetes , and hypothyroidism; no heat/cold intolerance;  Neuro: Negative; no changes in balance, headaches Skin: Negative; No rashes or skin lesions Psychiatric: Negative; No behavioral problems, depression Sleep: Negative; No snoring, daytime sleepiness, hypersomnolence, bruxism, restless legs, hypnogognic hallucinations, no cataplexy Other comprehensive 14 point system review is negative.   PE BP 112/74   Pulse (!) 56   Ht _0  (1.905 m)   Wt 232 lb (105.2 kg)   BMI 29.00 kg/m    Repeat blood pressure by me was 130/64.  Wt Readings from Last 3 Encounters:  08/04/16 232 lb (105.2 kg)  04/05/15 233 lb 3.2 oz (105.8 kg)  03/19/15 220 lb (99.8 kg)   General: Alert, oriented, no distress.  HEENT: Normocephalic, atraumatic. Pupils round and reactive; sclera anicteric;  Mouth/Parynx benign; Mallinpatti scale 3 Neck: No JVD, no carotid bruits; normal carotid upstroke Lungs: clear to ausculatation and percussion; no wheezing or rales Chest wall: Nontender to Heart: RRR, s1 s2 normal 3-1/4 diastolic murmur in the aortic position suggestive of AR.  No S3 or S4 gallop.  No rubs, thrills or heaves. Abdomen: soft, nontender; no hepatosplenomehaly, BS+; abdominal aorta nontender and not dilated by palpation. Back: No CVA tenderness Pulses 2+ Extremities: no clubbinbg cyanosis or edema, Homan's sign negative  Neurologic: grossly nonfocal Psychological: Normal affect and  mood  ECG (independently read by me): On his bradycardia with first-degree AV block with a ventricular rate of 56 bpm.  PR interval 212 ms.  Nonspecific T changes.  November 2016 ECG (independently read by me): Sinus bradycardia 59 bpm.  First degree AV block with a PR interval 208 ms.  Nonspecific T changes.  May 2015 ECG : Sinus rhythm at 59 beats per minute.  Borderline first-degree block with PR interval 206 ms.  No significant ST changes.  Prior ECG: Sinus bradycardia 55 beats per minute.  Nonspecific T changes. Normal intervals.  LABS:  BMP Latest Ref Rng & Units 10/11/2013 05/02/2013 11/09/2012  Glucose 70 - 99 mg/dL 145(H) 133(H) 150(H)  BUN 6 - 23 mg/dL _0 Creatinine 0.50 - 1.35 mg/dL 0.90 0.99 1.00  Sodium 135 - 145 mEq/L 141 140 141  Potassium 3.5 - 5.3 mEq/L 4.0 4.4 4.1  Chloride 96 - 112 mEq/L 105 105 104  CO2 19 - 32 mEq/L _1 Calcium 8.4 - 10.5 mg/dL 9.1 9.7 9.2   Hepatic Function Latest Ref Rng & Units 10/11/2013 11/14/2009 09/23/2009  Total Protein 6.0 - 8.3 g/dL 6.9 7.0 6.2  Albumin 3.5 - 5.2 g/dL 4.4 4.1 2.7(L)  AST 0 - 37 U/L 20 23 40(H)  ALT 0 - 53 U/L 17 19 66(H)  Alk Phosphatase 39 - 117 U/L 46 52 63  Total Bilirubin 0.2 - 1.2 mg/dL 0.6 0.8 0.5   CBC Latest Ref Rng & Units 10/11/2013 02/11/2010 11/14/2009  WBC 4.0 - 10.5 K/uL 4.5 5.8 4.8  Hemoglobin 13.0 - 17.0 g/dL 13.1 13.5 12.2(L)  Hematocrit 39.0 - 52.0 % 38.9(L) 41.6 37.3(L)  Platelets 150 - 400 K/uL 183 183 206   Lab Results  Component Value Date   MCV 68.7 (L) 10/11/2013   MCV 71.4 (L) 02/11/2010   MCV 73.5 (L) 11/14/2009   Lab Results  Component Value Date   TSH 6.283 (H) 11/14/2013   Lab Results  Component Value Date   HGBA1C 6.7 (H) 10/11/2013   Lipid Panel     Component Value Date/Time   CHOL 134 10/11/2013 1309   TRIG 48 10/11/2013 1309   HDL 43 10/11/2013 1309   LDLCALC 81 10/11/2013 1309    RADIOLOGY: No results found.  IMPRESSION:  1. Essential hypertension,  benign   2. Other specified hypothyroidism   3. Hyperlipidemia with target LDL less than 70   4. Controlled type 2 diabetes mellitus without complication, with long-term current use of insulin ([place])   5. Hemoglobin C (Hb-C) (HCC)   6. History of DVT of lower extremity     ASSESSMENT AND PLAN:  Mr. Azizi is a 66 year old gentleman who has a history of DVT and has not had any recurrence.  He continues to be asymptomatic with reference to this and is no longer on chronic anticoagulation, but continues to take aspirin 81 mg daily.   He denies recurrent leg swelling.  His blood pressure today is controlled on his current dose of irbesartan hydrochlorothiazide 300/12.5 and Toprol-XL 50 mg.  Remotely his TSH was elevated at 6.644 on his current dose of Synthroid 50 mcg, suggestive of residual, hypothyroidism, and his Synthroid dose has gradually been increased to its current dose now of 112 g.  He is tolerating atorvastatin 20 mg his hyperlipidemia and is no longer on Niaspan. He is on Januvia for his diabetes and  hemoglobin A1c was 6.7 in the past.  He has microcytic indices and several years ago.  I scheduled him for hemoglobin electrophoresis which revealed hemoglobin C.  Cardiac exam does suggest a 1-2/6 aortic insufficiency murmur.  Several years ago I had scheduled him for follow-up echo Doppler study which he never had done.  He sees Dr. Hilma Favors for his primary care.  I have recommended fasting laboratory be obtained putting a CBC, chemistry profile, lipid studies, TSH, and hemoglobin A1c.  Although he weighed 232 pounds in the office today, he was wearing very heavy shoes heavy clothing and he states his weight at home runs in the 218-220 range.  I detect him regarding the results of laboratory and changes will be made if necessary.  As long as he remains stable I will see him in one year for reevaluation.  Time spent: 25 minutes  Shelva Majestic, MD  08/06/2016  6:24 PM

## 2016-08-04 NOTE — Patient Instructions (Addendum)
Your physician recommends that you return for lab work FASTING. Lab slips provided today.  Your physician wants you to follow-up in: 1 year or sooner if needed. You will receive a reminder letter in the mail two months in advance. If you don't receive a letter, please call our office to schedule the follow-up appointment.  If you need a refill on your cardiac medications before your next appointment, please call your pharmacy.

## 2016-10-01 ENCOUNTER — Other Ambulatory Visit: Payer: Self-pay | Admitting: Cardiovascular Disease

## 2016-11-13 ENCOUNTER — Other Ambulatory Visit: Payer: Self-pay | Admitting: Cardiovascular Disease

## 2016-11-13 NOTE — Telephone Encounter (Signed)
Refilled irbesartan-hctz to pharmacy.

## 2017-04-15 DIAGNOSIS — E663 Overweight: Secondary | ICD-10-CM | POA: Diagnosis not present

## 2017-04-15 DIAGNOSIS — Z6826 Body mass index (BMI) 26.0-26.9, adult: Secondary | ICD-10-CM | POA: Diagnosis not present

## 2017-04-15 DIAGNOSIS — E1165 Type 2 diabetes mellitus with hyperglycemia: Secondary | ICD-10-CM | POA: Diagnosis not present

## 2017-04-15 DIAGNOSIS — Z1389 Encounter for screening for other disorder: Secondary | ICD-10-CM | POA: Diagnosis not present

## 2017-04-15 DIAGNOSIS — Z23 Encounter for immunization: Secondary | ICD-10-CM | POA: Diagnosis not present

## 2017-04-15 DIAGNOSIS — I10 Essential (primary) hypertension: Secondary | ICD-10-CM | POA: Diagnosis not present

## 2017-04-15 DIAGNOSIS — E039 Hypothyroidism, unspecified: Secondary | ICD-10-CM | POA: Diagnosis not present

## 2017-04-15 DIAGNOSIS — E063 Autoimmune thyroiditis: Secondary | ICD-10-CM | POA: Diagnosis not present

## 2017-05-10 ENCOUNTER — Other Ambulatory Visit: Payer: Self-pay | Admitting: Cardiovascular Disease

## 2017-05-19 ENCOUNTER — Ambulatory Visit: Payer: PPO | Admitting: Urology

## 2017-05-19 DIAGNOSIS — R972 Elevated prostate specific antigen [PSA]: Secondary | ICD-10-CM

## 2017-05-19 DIAGNOSIS — M79645 Pain in left finger(s): Secondary | ICD-10-CM | POA: Diagnosis not present

## 2017-05-19 DIAGNOSIS — M65322 Trigger finger, left index finger: Secondary | ICD-10-CM | POA: Diagnosis not present

## 2017-06-11 DIAGNOSIS — H44112 Panuveitis, left eye: Secondary | ICD-10-CM | POA: Diagnosis not present

## 2017-06-18 DIAGNOSIS — E119 Type 2 diabetes mellitus without complications: Secondary | ICD-10-CM | POA: Diagnosis not present

## 2017-06-18 DIAGNOSIS — H33059 Total retinal detachment, unspecified eye: Secondary | ICD-10-CM | POA: Diagnosis not present

## 2017-06-18 DIAGNOSIS — H401131 Primary open-angle glaucoma, bilateral, mild stage: Secondary | ICD-10-CM | POA: Diagnosis not present

## 2017-06-18 DIAGNOSIS — Z961 Presence of intraocular lens: Secondary | ICD-10-CM | POA: Diagnosis not present

## 2017-06-19 DIAGNOSIS — E039 Hypothyroidism, unspecified: Secondary | ICD-10-CM | POA: Diagnosis not present

## 2017-06-30 DIAGNOSIS — H401131 Primary open-angle glaucoma, bilateral, mild stage: Secondary | ICD-10-CM | POA: Diagnosis not present

## 2017-06-30 DIAGNOSIS — H2012 Chronic iridocyclitis, left eye: Secondary | ICD-10-CM | POA: Diagnosis not present

## 2017-06-30 DIAGNOSIS — E119 Type 2 diabetes mellitus without complications: Secondary | ICD-10-CM | POA: Diagnosis not present

## 2017-06-30 DIAGNOSIS — H33059 Total retinal detachment, unspecified eye: Secondary | ICD-10-CM | POA: Diagnosis not present

## 2017-07-14 DIAGNOSIS — H2012 Chronic iridocyclitis, left eye: Secondary | ICD-10-CM | POA: Diagnosis not present

## 2017-07-14 DIAGNOSIS — E119 Type 2 diabetes mellitus without complications: Secondary | ICD-10-CM | POA: Diagnosis not present

## 2017-07-14 DIAGNOSIS — H33059 Total retinal detachment, unspecified eye: Secondary | ICD-10-CM | POA: Diagnosis not present

## 2017-07-14 DIAGNOSIS — H401131 Primary open-angle glaucoma, bilateral, mild stage: Secondary | ICD-10-CM | POA: Diagnosis not present

## 2017-07-28 DIAGNOSIS — H2102 Hyphema, left eye: Secondary | ICD-10-CM | POA: Diagnosis not present

## 2017-07-28 DIAGNOSIS — H2012 Chronic iridocyclitis, left eye: Secondary | ICD-10-CM | POA: Diagnosis not present

## 2017-07-28 DIAGNOSIS — H33059 Total retinal detachment, unspecified eye: Secondary | ICD-10-CM | POA: Diagnosis not present

## 2017-07-28 DIAGNOSIS — H401131 Primary open-angle glaucoma, bilateral, mild stage: Secondary | ICD-10-CM | POA: Diagnosis not present

## 2017-07-28 DIAGNOSIS — Z961 Presence of intraocular lens: Secondary | ICD-10-CM | POA: Diagnosis not present

## 2017-08-05 DIAGNOSIS — E039 Hypothyroidism, unspecified: Secondary | ICD-10-CM | POA: Diagnosis not present

## 2017-08-06 DIAGNOSIS — H401131 Primary open-angle glaucoma, bilateral, mild stage: Secondary | ICD-10-CM | POA: Diagnosis not present

## 2017-08-06 DIAGNOSIS — H2102 Hyphema, left eye: Secondary | ICD-10-CM | POA: Diagnosis not present

## 2017-08-06 DIAGNOSIS — H2012 Chronic iridocyclitis, left eye: Secondary | ICD-10-CM | POA: Diagnosis not present

## 2017-08-19 DIAGNOSIS — H401131 Primary open-angle glaucoma, bilateral, mild stage: Secondary | ICD-10-CM | POA: Diagnosis not present

## 2017-08-19 DIAGNOSIS — H2102 Hyphema, left eye: Secondary | ICD-10-CM | POA: Diagnosis not present

## 2017-08-19 DIAGNOSIS — H2012 Chronic iridocyclitis, left eye: Secondary | ICD-10-CM | POA: Diagnosis not present

## 2017-08-19 DIAGNOSIS — E119 Type 2 diabetes mellitus without complications: Secondary | ICD-10-CM | POA: Diagnosis not present

## 2017-09-02 ENCOUNTER — Ambulatory Visit: Payer: PPO | Admitting: Cardiovascular Disease

## 2017-09-08 ENCOUNTER — Encounter: Payer: Self-pay | Admitting: Cardiovascular Disease

## 2017-09-08 ENCOUNTER — Ambulatory Visit (INDEPENDENT_AMBULATORY_CARE_PROVIDER_SITE_OTHER): Payer: PPO | Admitting: Cardiovascular Disease

## 2017-09-08 VITALS — BP 132/69 | HR 60 | Ht 75.0 in | Wt 229.3 lb

## 2017-09-08 DIAGNOSIS — D582 Other hemoglobinopathies: Secondary | ICD-10-CM | POA: Diagnosis not present

## 2017-09-08 DIAGNOSIS — Z794 Long term (current) use of insulin: Secondary | ICD-10-CM

## 2017-09-08 DIAGNOSIS — I10 Essential (primary) hypertension: Secondary | ICD-10-CM | POA: Diagnosis not present

## 2017-09-08 DIAGNOSIS — E038 Other specified hypothyroidism: Secondary | ICD-10-CM | POA: Diagnosis not present

## 2017-09-08 DIAGNOSIS — Z86718 Personal history of other venous thrombosis and embolism: Secondary | ICD-10-CM

## 2017-09-08 DIAGNOSIS — E119 Type 2 diabetes mellitus without complications: Secondary | ICD-10-CM | POA: Diagnosis not present

## 2017-09-08 DIAGNOSIS — E785 Hyperlipidemia, unspecified: Secondary | ICD-10-CM

## 2017-09-08 MED ORDER — ATORVASTATIN CALCIUM 40 MG PO TABS: 40.0000 mg | ORAL_TABLET | Freq: Every day | ORAL | 3 refills | Status: DC

## 2017-09-08 NOTE — Patient Instructions (Signed)
Medication Instructions:  INCREASE atorvastatin to 40 mg daily  Labwork: At PCP within 2 months-please have the results faxed to [phone]  Follow-Up: Your physician wants you to follow-up in: 12 months with Dr. Tresa EndoKelly. You will receive a reminder letter in the mail two months in advance. If you don't receive a letter, please call our office to schedule the follow-up appointment.   Any Other Special Instructions Will Be Listed Below (If Applicable).     If you need a refill on your cardiac medications before your next appointment, please call your pharmacy.

## 2017-09-08 NOTE — Progress Notes (Signed)
Patient ID: Jordan Goodwin, male   DOB: 09/29/1950, 67 y.o.   MRN: [id number]                                                                                                                                                 HPI: Jordan TOMEO is a 67 y.o. male who presents to the office for a 12 month followup evaluation.  Jordan Goodwin has a history of left lower extremity DVT in April 2011 involving the left posterior tibial vein. He was treated with Lovenox and Coumadin and subsequently developed a calf hematoma leading to compartment syndrome requiring fasciotomy on 09/18/2009 and had a spontaneous pulmonary hemorrhage 09/22/2009. 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 He has history of diabetes mellitus and remotely he has had elevated PSAs felt  to be due to infection rather than cancer. Over the past year, he was taken off his Benicar 20 mg and this was substituted by his insurance company  with losartan 50 mg  titrated to 100 mg daily. Subsequently, this still did not control his blood pressure and he was switched to research and hydrochlorothiazide 300/12.5 mg.    An echo Doppler study in March 2012 with suggested mild to moderate aortic insufficiency.  Normal systolic function.  There was mild mitral regurgitation, mild tricuspid regurgitation.  Pulmonary pressure was normal.    He has a history of hypothyroidism and recently has been on Synthroid at 50 mcg.  He also has a history of hyperlipidemia, in addition to type 2 diabetes mellitus.  Laboratory last year revealed a total cholesterol of 134, HDL 43, triglycerides 48, LDL 81.  However, LDL particle number was increased at 1299.     I last saw him one year ago.  Over the past year, he has continued to do well.  Specifically, he is exercising at least 3 days/week.  He is working as an Chief Financial Officer at [employer].  Denies any chest pain.  He  denies PND orthopnea.  Issues with leg swelling.  He is followed by Dr. Hilma Favors.  He has a history of diabetes mellitus for approximately 10 years.  Laboratory from November 2018 showed hemoglobin A1c elevated at 7.6.  He has a history of hyperlipidemia.  His LDL cholesterol in October 2018 was still elevated at 92.  He has hypothyroidism and is on levothyroxine 175 mcg.  He states he had a recent TSH blood test was told that his thyroid function was well treated.  He presents for evaluation.  Past Medical History  Diagnosis Date  . Hypertension   . Diabetes mellitus   . Mixed hyperlipidemia     low HDL  . 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 DVT (deep venous  thrombosis) 08/2009    left lower extremity and had a DVT inthe left posterior tibial vein  . Murmur     Past Surgical History  Procedure Laterality Date  . Tonsillectomy  1962  . Retinal detachment surgery      x's 2  . Cataract extraction, bilateral    . 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 Knee arthroscopy  2012    left knee/torn meniscus    Allergies  Allergen Reactions  . Warfarin And Related     Compartment syndrome    Current Outpatient Prescriptions  Medication Sig Dispense Refill  . aspirin 81 MG EC tablet Take 81 mg by mouth daily. Swallow whole.      Marland Kitchen atorvastatin (LIPITOR) 20 MG tablet Take 20 mg by mouth daily.      Marland Kitchen glucosamine-chondroitin 500-400 MG tablet Take 1 tablet by mouth 2 (two) times daily.      . irbesartan (AVAPRO) 300 MG tablet Take 1 tablet (300 mg total) by mouth at bedtime.  30 tablet  1  . levothyroxine (SYNTHROID, LEVOTHROID) 50 MCG tablet Take 50 mcg by mouth daily before breakfast.      . metoprolol succinate (TOPROL-XL) 50 MG 24 hr tablet Take 50 mg by mouth daily. Take with or immediately following a meal.      . niacin (NIASPAN) 1000 MG CR tablet TAKE 1 TABLET BY MOUTH AT BEDTIME  90 tablet  3  . sitaGLIPtin (JANUVIA) 100 MG tablet  Take 100 mg by mouth daily.       No current facility-administered medications for this visit.   Socially he is married and has one child age 3. He exercises approximately 3-4 days per week. There is no tobacco history. He works as an Chief Financial Officer for [employer].   ROS General: Positive for fatigue; No fevers, chills, or night sweats;  HEENT: Negative; No changes in vision or hearing, sinus congestion, difficulty swallowing Pulmonary: Negative; No cough, wheezing, shortness of breath, hemoptysis Cardiovascular: Negative; No chest pain, presyncope, syncope, palpatations GI: Negative; No nausea, vomiting, diarrhea, or abdominal pain GU: Negative; No dysuria, hematuria, or difficulty voiding Musculoskeletal: Negative; no myalgias, joint pain, or weakness Hematologic/Oncology: Negative; no easy bruising, bleeding Endocrine: Positive for diabetes , and hypothyroidism; no heat/cold intolerance;  Neuro: Negative; no changes in balance, headaches Skin: Negative; No rashes or skin lesions Psychiatric: Negative; No behavioral problems, depression Sleep: Negative; No snoring, daytime sleepiness, hypersomnolence, bruxism, restless legs, hypnogognic hallucinations, no cataplexy Other comprehensive 14 point system review is negative.   PE BP 132/69   Pulse 60   Ht '6\' 3"'  (1.905 m)   Wt 229 lb 4.8 oz (104 kg)   BMI 28.66 kg/m    Repeat blood pressure was 126/72  Wt Readings from Last 3 Encounters:  09/08/17 229 lb 4.8 oz (104 kg)  08/04/16 232 lb (105.2 kg)  04/05/15 233 lb 3.2 oz (105.8 kg)    General: Alert, oriented, no distress.  Skin: normal turgor, no rashes, warm and dry HEENT: Normocephalic, atraumatic. Pupils equal round and reactive to light; sclera anicteric; extraocular muscles intact;  Nose without nasal septal hypertrophy Mouth/Parynx benign; Mallinpatti scale 3 Neck: No JVD, no carotid bruits; normal carotid upstroke Lungs: clear to ausculatation and percussion; no wheezing  or rales Chest wall: without tenderness to palpitation Heart: PMI not displaced, RRR, s1 s2 normal, 1/6 systolic murmur, no diastolic murmur, no rubs, gallops, thrills, or heaves Abdomen: soft, nontender; no  hepatosplenomehaly, BS+; abdominal aorta nontender and not dilated by palpation. Back: no CVA tenderness Pulses 2+ Musculoskeletal: full range of motion, normal strength, no joint deformities Extremities: No swelling; no clubbing cyanosis or edema, Homan's sign negative  Neurologic: grossly nonfocal; Cranial nerves grossly wnl Psychologic: Normal mood and affect   ECG (independently read by me): Normal sinus rhythm at 60 bpm.  Borderline first-degree AV block with a PR segment 206 ms.  March 2018 ECG (independently read by me): On his bradycardia with first-degree AV block with a ventricular rate of 56 bpm.  PR interval 212 ms.  Nonspecific T changes.  November 2016 ECG (independently read by me): Sinus bradycardia 59 bpm.  First degree AV block with a PR interval 208 ms.  Nonspecific T changes.  May 2015 ECG : Sinus rhythm at 59 beats per minute.  Borderline first-degree block with PR interval 206 ms.  No significant ST changes.  Prior ECG: Sinus bradycardia 55 beats per minute.  Nonspecific T changes. Normal intervals.  LABS:  BMP Latest Ref Rng & Units 10/11/2013 05/02/2013 11/09/2012  Glucose 70 - 99 mg/dL 145(H) 133(H) 150(H)  BUN 6 - 23 mg/dL '14 19 13  ' Creatinine 0.50 - 1.35 mg/dL 0.90 0.99 1.00  Sodium 135 - 145 mEq/L 141 140 141  Potassium 3.5 - 5.3 mEq/L 4.0 4.4 4.1  Chloride 96 - 112 mEq/L 105 105 104  CO2 19 - 32 mEq/L '28 25 26  ' Calcium 8.4 - 10.5 mg/dL 9.1 9.7 9.2   Hepatic Function Latest Ref Rng & Units 10/11/2013 11/14/2009 09/23/2009  Total Protein 6.0 - 8.3 g/dL 6.9 7.0 6.2  Albumin 3.5 - 5.2 g/dL 4.4 4.1 2.7(L)  AST 0 - 37 U/L 20 23 40(H)  ALT 0 - 53 U/L 17 19 66(H)  Alk Phosphatase 39 - 117 U/L 46 52 63  Total Bilirubin 0.2 - 1.2 mg/dL 0.6 0.8 0.5   CBC  Latest Ref Rng & Units 10/11/2013 02/11/2010 11/14/2009  WBC 4.0 - 10.5 K/uL 4.5 5.8 4.8  Hemoglobin 13.0 - 17.0 g/dL 13.1 13.5 12.2(L)  Hematocrit 39.0 - 52.0 % 38.9(L) 41.6 37.3(L)  Platelets 150 - 400 K/uL 183 183 206   Lab Results  Component Value Date   MCV 68.7 (L) 10/11/2013   MCV 71.4 (L) 02/11/2010   MCV 73.5 (L) 11/14/2009   Lab Results  Component Value Date   TSH 6.283 (H) 11/14/2013   Lab Results  Component Value Date   HGBA1C 6.7 (H) 10/11/2013   Lipid Panel     Component Value Date/Time   CHOL 134 10/11/2013 1309   TRIG 48 10/11/2013 1309   HDL 43 10/11/2013 1309   LDLCALC 81 10/11/2013 1309    RADIOLOGY: No results found.  IMPRESSION:  1. Essential hypertension, benign   2. Hyperlipidemia with target LDL less than 70   3. Controlled type 2 diabetes mellitus without complication, with long-term current use of insulin ([place])   4. Other specified hypothyroidism   5. History of DVT (deep vein thrombosis)   6. Hemoglobin C (Hb-C) (HCC)     ASSESSMENT AND PLAN:  Mr. Berkel is a 67 year old gentleman who has a history of DVT and has not had any recurrence.  He continues to be asymptomatic with reference to this and is no longer on chronic anticoagulation, but continues to take aspirin 81 mg daily.   He denies recurrent leg swelling.  He has a history of hypertension.  His blood pressure today is well controlled on  Toprol-XL 50 mg daily and irbesartan/HCTZ 300/12.5 mg.  He is diabetic of approximately 10 years duration.  Review of his meds indicates that he is only on Januvia 100 mg.  Does not appear that he is on metformin.  His hemoglobin A1c was 7.6 in November 2018.  I have suggested that this be reassessed by Dr. Hilma Favors and he may require additional therapy.  As a diabetic, target LDL is less than 70.  He has been maintained on atorvastatin 20 mg and LDL cholesterol was still increased at 92 when last checked in October 2018.  I have recommended further  titration of atorvastatin to 40 mg daily.  He continues to want to be on baby aspirin has had some mild weight loss.  BMI today is 28.66.  I have recommended that he see Dr. Hilma Favors over the next several months with repeat laboratory.  From a cardiac standpoint he is doing well.  He is not having any anginal type symptoms.  He denies any symptoms of CHF.  Denies any bleeding.  His last hemoglobin in October 2018 was stable at 13.5.  As long as he remains stable I will see him in 1 year for reevaluation. Time spent: 25 minutes  Shelva Majestic, MD  09/08/2017  2:43 PM

## 2017-09-10 DIAGNOSIS — H401131 Primary open-angle glaucoma, bilateral, mild stage: Secondary | ICD-10-CM | POA: Diagnosis not present

## 2017-09-10 DIAGNOSIS — H2012 Chronic iridocyclitis, left eye: Secondary | ICD-10-CM | POA: Diagnosis not present

## 2017-09-10 DIAGNOSIS — Z961 Presence of intraocular lens: Secondary | ICD-10-CM | POA: Diagnosis not present

## 2017-09-10 DIAGNOSIS — H2102 Hyphema, left eye: Secondary | ICD-10-CM | POA: Diagnosis not present

## 2017-09-17 DIAGNOSIS — T1502XA Foreign body in cornea, left eye, initial encounter: Secondary | ICD-10-CM | POA: Diagnosis not present

## 2017-09-17 DIAGNOSIS — E039 Hypothyroidism, unspecified: Secondary | ICD-10-CM | POA: Diagnosis not present

## 2017-09-17 DIAGNOSIS — I10 Essential (primary) hypertension: Secondary | ICD-10-CM | POA: Diagnosis not present

## 2017-09-17 DIAGNOSIS — Z79899 Other long term (current) drug therapy: Secondary | ICD-10-CM | POA: Diagnosis not present

## 2017-09-17 DIAGNOSIS — Z86718 Personal history of other venous thrombosis and embolism: Secondary | ICD-10-CM | POA: Diagnosis not present

## 2017-09-17 DIAGNOSIS — W458XXA Other foreign body or object entering through skin, initial encounter: Secondary | ICD-10-CM | POA: Diagnosis not present

## 2017-09-17 DIAGNOSIS — S0502XA Injury of conjunctiva and corneal abrasion without foreign body, left eye, initial encounter: Secondary | ICD-10-CM | POA: Diagnosis not present

## 2017-09-17 DIAGNOSIS — Z7982 Long term (current) use of aspirin: Secondary | ICD-10-CM | POA: Diagnosis not present

## 2017-09-17 DIAGNOSIS — E119 Type 2 diabetes mellitus without complications: Secondary | ICD-10-CM | POA: Diagnosis not present

## 2017-09-17 DIAGNOSIS — Z7984 Long term (current) use of oral hypoglycemic drugs: Secondary | ICD-10-CM | POA: Diagnosis not present

## 2017-09-17 DIAGNOSIS — E7800 Pure hypercholesterolemia, unspecified: Secondary | ICD-10-CM | POA: Diagnosis not present

## 2017-09-19 ENCOUNTER — Other Ambulatory Visit: Payer: Self-pay | Admitting: Cardiovascular Disease

## 2017-09-24 DIAGNOSIS — H1032 Unspecified acute conjunctivitis, left eye: Secondary | ICD-10-CM | POA: Diagnosis not present

## 2017-09-25 DIAGNOSIS — Z1389 Encounter for screening for other disorder: Secondary | ICD-10-CM | POA: Diagnosis not present

## 2017-09-25 DIAGNOSIS — E1165 Type 2 diabetes mellitus with hyperglycemia: Secondary | ICD-10-CM | POA: Diagnosis not present

## 2017-09-25 DIAGNOSIS — Z6827 Body mass index (BMI) 27.0-27.9, adult: Secondary | ICD-10-CM | POA: Diagnosis not present

## 2017-09-25 DIAGNOSIS — H8111 Benign paroxysmal vertigo, right ear: Secondary | ICD-10-CM | POA: Diagnosis not present

## 2017-09-25 DIAGNOSIS — E663 Overweight: Secondary | ICD-10-CM | POA: Diagnosis not present

## 2017-09-25 DIAGNOSIS — I10 Essential (primary) hypertension: Secondary | ICD-10-CM | POA: Diagnosis not present

## 2017-09-25 DIAGNOSIS — E785 Hyperlipidemia, unspecified: Secondary | ICD-10-CM | POA: Diagnosis not present

## 2017-10-06 DIAGNOSIS — E663 Overweight: Secondary | ICD-10-CM | POA: Diagnosis not present

## 2017-10-06 DIAGNOSIS — E119 Type 2 diabetes mellitus without complications: Secondary | ICD-10-CM | POA: Diagnosis not present

## 2017-10-06 DIAGNOSIS — Z1389 Encounter for screening for other disorder: Secondary | ICD-10-CM | POA: Diagnosis not present

## 2017-10-06 DIAGNOSIS — E782 Mixed hyperlipidemia: Secondary | ICD-10-CM | POA: Diagnosis not present

## 2017-10-06 DIAGNOSIS — Z6827 Body mass index (BMI) 27.0-27.9, adult: Secondary | ICD-10-CM | POA: Diagnosis not present

## 2017-10-06 DIAGNOSIS — E039 Hypothyroidism, unspecified: Secondary | ICD-10-CM | POA: Diagnosis not present

## 2017-10-22 DIAGNOSIS — H401131 Primary open-angle glaucoma, bilateral, mild stage: Secondary | ICD-10-CM | POA: Diagnosis not present

## 2017-10-22 DIAGNOSIS — H2102 Hyphema, left eye: Secondary | ICD-10-CM | POA: Diagnosis not present

## 2017-10-22 DIAGNOSIS — H2012 Chronic iridocyclitis, left eye: Secondary | ICD-10-CM | POA: Diagnosis not present

## 2017-10-22 DIAGNOSIS — Z961 Presence of intraocular lens: Secondary | ICD-10-CM | POA: Diagnosis not present

## 2017-10-23 DIAGNOSIS — E119 Type 2 diabetes mellitus without complications: Secondary | ICD-10-CM | POA: Diagnosis not present

## 2017-10-23 DIAGNOSIS — I10 Essential (primary) hypertension: Secondary | ICD-10-CM | POA: Diagnosis not present

## 2017-10-23 DIAGNOSIS — Z6826 Body mass index (BMI) 26.0-26.9, adult: Secondary | ICD-10-CM | POA: Diagnosis not present

## 2017-10-23 DIAGNOSIS — E7849 Other hyperlipidemia: Secondary | ICD-10-CM | POA: Diagnosis not present

## 2017-10-23 DIAGNOSIS — D582 Other hemoglobinopathies: Secondary | ICD-10-CM | POA: Diagnosis not present

## 2017-10-23 DIAGNOSIS — E663 Overweight: Secondary | ICD-10-CM | POA: Diagnosis not present

## 2017-10-27 DIAGNOSIS — Z7689 Persons encountering health services in other specified circumstances: Secondary | ICD-10-CM | POA: Diagnosis not present

## 2017-10-27 DIAGNOSIS — E119 Type 2 diabetes mellitus without complications: Secondary | ICD-10-CM | POA: Diagnosis not present

## 2017-11-03 ENCOUNTER — Other Ambulatory Visit: Payer: Self-pay | Admitting: Cardiovascular Disease

## 2017-12-09 DIAGNOSIS — E039 Hypothyroidism, unspecified: Secondary | ICD-10-CM | POA: Diagnosis not present

## 2018-01-07 DIAGNOSIS — H2012 Chronic iridocyclitis, left eye: Secondary | ICD-10-CM | POA: Diagnosis not present

## 2018-01-07 DIAGNOSIS — H02826 Cysts of left eye, unspecified eyelid: Secondary | ICD-10-CM | POA: Diagnosis not present

## 2018-01-07 DIAGNOSIS — H2102 Hyphema, left eye: Secondary | ICD-10-CM | POA: Diagnosis not present

## 2018-01-07 DIAGNOSIS — H401131 Primary open-angle glaucoma, bilateral, mild stage: Secondary | ICD-10-CM | POA: Diagnosis not present

## 2018-01-28 DIAGNOSIS — E063 Autoimmune thyroiditis: Secondary | ICD-10-CM | POA: Diagnosis not present

## 2018-01-28 DIAGNOSIS — E663 Overweight: Secondary | ICD-10-CM | POA: Diagnosis not present

## 2018-01-28 DIAGNOSIS — Z0001 Encounter for general adult medical examination with abnormal findings: Secondary | ICD-10-CM | POA: Diagnosis not present

## 2018-01-28 DIAGNOSIS — Z23 Encounter for immunization: Secondary | ICD-10-CM | POA: Diagnosis not present

## 2018-01-28 DIAGNOSIS — G4733 Obstructive sleep apnea (adult) (pediatric): Secondary | ICD-10-CM | POA: Diagnosis not present

## 2018-01-28 DIAGNOSIS — Z1389 Encounter for screening for other disorder: Secondary | ICD-10-CM | POA: Diagnosis not present

## 2018-01-28 DIAGNOSIS — E119 Type 2 diabetes mellitus without complications: Secondary | ICD-10-CM | POA: Diagnosis not present

## 2018-01-28 DIAGNOSIS — Z6826 Body mass index (BMI) 26.0-26.9, adult: Secondary | ICD-10-CM | POA: Diagnosis not present

## 2018-01-28 DIAGNOSIS — I10 Essential (primary) hypertension: Secondary | ICD-10-CM | POA: Diagnosis not present

## 2018-01-28 DIAGNOSIS — E785 Hyperlipidemia, unspecified: Secondary | ICD-10-CM | POA: Diagnosis not present

## 2018-02-04 DIAGNOSIS — Z6826 Body mass index (BMI) 26.0-26.9, adult: Secondary | ICD-10-CM | POA: Diagnosis not present

## 2018-02-04 DIAGNOSIS — Z1389 Encounter for screening for other disorder: Secondary | ICD-10-CM | POA: Diagnosis not present

## 2018-02-04 DIAGNOSIS — E785 Hyperlipidemia, unspecified: Secondary | ICD-10-CM | POA: Diagnosis not present

## 2018-02-04 DIAGNOSIS — Z0000 Encounter for general adult medical examination without abnormal findings: Secondary | ICD-10-CM | POA: Diagnosis not present

## 2018-02-04 DIAGNOSIS — Z0001 Encounter for general adult medical examination with abnormal findings: Secondary | ICD-10-CM | POA: Diagnosis not present

## 2018-02-04 DIAGNOSIS — E663 Overweight: Secondary | ICD-10-CM | POA: Diagnosis not present

## 2018-02-22 DIAGNOSIS — J042 Acute laryngotracheitis: Secondary | ICD-10-CM | POA: Diagnosis not present

## 2018-02-22 DIAGNOSIS — N400 Enlarged prostate without lower urinary tract symptoms: Secondary | ICD-10-CM | POA: Diagnosis not present

## 2018-02-22 DIAGNOSIS — I10 Essential (primary) hypertension: Secondary | ICD-10-CM | POA: Diagnosis not present

## 2018-02-22 DIAGNOSIS — Z6826 Body mass index (BMI) 26.0-26.9, adult: Secondary | ICD-10-CM | POA: Diagnosis not present

## 2018-02-22 DIAGNOSIS — E119 Type 2 diabetes mellitus without complications: Secondary | ICD-10-CM | POA: Diagnosis not present

## 2018-02-22 DIAGNOSIS — E663 Overweight: Secondary | ICD-10-CM | POA: Diagnosis not present

## 2018-02-22 DIAGNOSIS — J479 Bronchiectasis, uncomplicated: Secondary | ICD-10-CM | POA: Diagnosis not present

## 2018-02-22 DIAGNOSIS — J329 Chronic sinusitis, unspecified: Secondary | ICD-10-CM | POA: Diagnosis not present

## 2018-02-22 DIAGNOSIS — E063 Autoimmune thyroiditis: Secondary | ICD-10-CM | POA: Diagnosis not present

## 2018-03-11 DIAGNOSIS — H33059 Total retinal detachment, unspecified eye: Secondary | ICD-10-CM | POA: Diagnosis not present

## 2018-03-11 DIAGNOSIS — H401131 Primary open-angle glaucoma, bilateral, mild stage: Secondary | ICD-10-CM | POA: Diagnosis not present

## 2018-03-11 DIAGNOSIS — H2012 Chronic iridocyclitis, left eye: Secondary | ICD-10-CM | POA: Diagnosis not present

## 2018-03-11 DIAGNOSIS — E119 Type 2 diabetes mellitus without complications: Secondary | ICD-10-CM | POA: Diagnosis not present

## 2018-03-11 DIAGNOSIS — H2102 Hyphema, left eye: Secondary | ICD-10-CM | POA: Diagnosis not present

## 2018-03-11 DIAGNOSIS — H02826 Cysts of left eye, unspecified eyelid: Secondary | ICD-10-CM | POA: Diagnosis not present

## 2018-03-15 DIAGNOSIS — M545 Low back pain: Secondary | ICD-10-CM | POA: Diagnosis not present

## 2018-03-15 DIAGNOSIS — E663 Overweight: Secondary | ICD-10-CM | POA: Diagnosis not present

## 2018-03-15 DIAGNOSIS — Z6826 Body mass index (BMI) 26.0-26.9, adult: Secondary | ICD-10-CM | POA: Diagnosis not present

## 2018-03-15 DIAGNOSIS — Z23 Encounter for immunization: Secondary | ICD-10-CM | POA: Diagnosis not present

## 2018-03-15 DIAGNOSIS — R3129 Other microscopic hematuria: Secondary | ICD-10-CM | POA: Diagnosis not present

## 2018-03-16 ENCOUNTER — Ambulatory Visit (HOSPITAL_COMMUNITY)
Admission: RE | Admit: 2018-03-16 | Discharge: 2018-03-16 | Disposition: A | Discharge status: Home / Self Care | Payer: PPO | Source: Ambulatory Visit | Attending: Family Medicine | Admitting: Family Medicine

## 2018-03-16 ENCOUNTER — Other Ambulatory Visit (HOSPITAL_COMMUNITY): Payer: Self-pay | Admitting: Family Medicine

## 2018-03-16 DIAGNOSIS — N200 Calculus of kidney: Secondary | ICD-10-CM | POA: Insufficient documentation

## 2018-03-16 DIAGNOSIS — R3129 Other microscopic hematuria: Secondary | ICD-10-CM

## 2018-03-16 DIAGNOSIS — E663 Overweight: Secondary | ICD-10-CM | POA: Diagnosis not present

## 2018-03-16 DIAGNOSIS — Z23 Encounter for immunization: Secondary | ICD-10-CM | POA: Diagnosis not present

## 2018-03-16 DIAGNOSIS — J439 Emphysema, unspecified: Secondary | ICD-10-CM | POA: Diagnosis not present

## 2018-03-16 DIAGNOSIS — Z6826 Body mass index (BMI) 26.0-26.9, adult: Secondary | ICD-10-CM | POA: Diagnosis not present

## 2018-03-16 DIAGNOSIS — M545 Low back pain: Secondary | ICD-10-CM | POA: Diagnosis not present

## 2018-03-16 DIAGNOSIS — I700 Atherosclerosis of aorta: Secondary | ICD-10-CM | POA: Diagnosis not present

## 2018-03-16 DIAGNOSIS — K5730 Diverticulosis of large intestine without perforation or abscess without bleeding: Secondary | ICD-10-CM | POA: Insufficient documentation

## 2018-03-19 DIAGNOSIS — H4312 Vitreous hemorrhage, left eye: Secondary | ICD-10-CM | POA: Diagnosis not present

## 2018-03-19 DIAGNOSIS — H2102 Hyphema, left eye: Secondary | ICD-10-CM | POA: Diagnosis not present

## 2018-03-30 ENCOUNTER — Ambulatory Visit (INDEPENDENT_AMBULATORY_CARE_PROVIDER_SITE_OTHER): Payer: PPO | Admitting: Urology

## 2018-03-30 ENCOUNTER — Other Ambulatory Visit (HOSPITAL_COMMUNITY)
Admission: AD | Admit: 2018-03-30 | Discharge: 2018-03-30 | Disposition: A | Discharge status: Home / Self Care | Payer: PPO | Source: Skilled Nursing Facility | Attending: Urology | Admitting: Urology

## 2018-03-30 DIAGNOSIS — N200 Calculus of kidney: Secondary | ICD-10-CM | POA: Diagnosis not present

## 2018-03-30 DIAGNOSIS — R972 Elevated prostate specific antigen [PSA]: Secondary | ICD-10-CM | POA: Diagnosis not present

## 2018-03-30 DIAGNOSIS — N281 Cyst of kidney, acquired: Secondary | ICD-10-CM | POA: Diagnosis not present

## 2018-03-30 DIAGNOSIS — Q632 Ectopic kidney: Secondary | ICD-10-CM

## 2018-03-30 DIAGNOSIS — R311 Benign essential microscopic hematuria: Secondary | ICD-10-CM | POA: Insufficient documentation

## 2018-03-30 LAB — URINALYSIS, COMPLETE (UACMP) WITH MICROSCOPIC
Bacteria, UA: NONE SEEN
Bilirubin Urine: NEGATIVE
Glucose, UA: NEGATIVE mg/dL
Ketones, ur: NEGATIVE mg/dL
Leukocytes, UA: NEGATIVE
Nitrite: NEGATIVE
PH: 5 (ref 5.0–8.0)
Protein, ur: NEGATIVE mg/dL
SPECIFIC GRAVITY, URINE: 1.02 (ref 1.005–1.030)

## 2018-04-08 DIAGNOSIS — H2102 Hyphema, left eye: Secondary | ICD-10-CM | POA: Diagnosis not present

## 2018-04-08 DIAGNOSIS — H4312 Vitreous hemorrhage, left eye: Secondary | ICD-10-CM | POA: Diagnosis not present

## 2018-04-08 DIAGNOSIS — H2012 Chronic iridocyclitis, left eye: Secondary | ICD-10-CM | POA: Diagnosis not present

## 2018-04-08 DIAGNOSIS — H43811 Vitreous degeneration, right eye: Secondary | ICD-10-CM | POA: Diagnosis not present

## 2018-05-06 DIAGNOSIS — H2012 Chronic iridocyclitis, left eye: Secondary | ICD-10-CM | POA: Diagnosis not present

## 2018-05-06 DIAGNOSIS — H2102 Hyphema, left eye: Secondary | ICD-10-CM | POA: Diagnosis not present

## 2018-05-06 DIAGNOSIS — H43811 Vitreous degeneration, right eye: Secondary | ICD-10-CM | POA: Diagnosis not present

## 2018-07-15 DIAGNOSIS — H2102 Hyphema, left eye: Secondary | ICD-10-CM | POA: Diagnosis not present

## 2018-07-15 DIAGNOSIS — T8529XS Other mechanical complication of intraocular lens, sequela: Secondary | ICD-10-CM | POA: Diagnosis not present

## 2018-07-15 DIAGNOSIS — H4052X3 Glaucoma secondary to other eye disorders, left eye, severe stage: Secondary | ICD-10-CM | POA: Diagnosis not present

## 2018-07-20 DIAGNOSIS — H4020X2 Unspecified primary angle-closure glaucoma, moderate stage: Secondary | ICD-10-CM | POA: Diagnosis not present

## 2018-07-20 DIAGNOSIS — H4052X3 Glaucoma secondary to other eye disorders, left eye, severe stage: Secondary | ICD-10-CM | POA: Diagnosis not present

## 2018-09-16 DIAGNOSIS — Z0001 Encounter for general adult medical examination with abnormal findings: Secondary | ICD-10-CM | POA: Diagnosis not present

## 2018-09-16 DIAGNOSIS — H906 Mixed conductive and sensorineural hearing loss, bilateral: Secondary | ICD-10-CM | POA: Diagnosis not present

## 2018-09-16 DIAGNOSIS — H9313 Tinnitus, bilateral: Secondary | ICD-10-CM | POA: Diagnosis not present

## 2018-09-16 DIAGNOSIS — H903 Sensorineural hearing loss, bilateral: Secondary | ICD-10-CM | POA: Diagnosis not present

## 2018-09-16 DIAGNOSIS — H6123 Impacted cerumen, bilateral: Secondary | ICD-10-CM | POA: Diagnosis not present

## 2018-09-16 DIAGNOSIS — I495 Sick sinus syndrome: Secondary | ICD-10-CM | POA: Diagnosis not present

## 2018-09-16 DIAGNOSIS — L8940 Pressure ulcer of contiguous site of back, buttock and hip, unspecified stage: Secondary | ICD-10-CM | POA: Diagnosis not present

## 2018-09-16 DIAGNOSIS — K56609 Unspecified intestinal obstruction, unspecified as to partial versus complete obstruction: Secondary | ICD-10-CM | POA: Diagnosis not present

## 2018-09-16 DIAGNOSIS — Z1389 Encounter for screening for other disorder: Secondary | ICD-10-CM | POA: Diagnosis not present

## 2018-09-16 DIAGNOSIS — H6983 Other specified disorders of Eustachian tube, bilateral: Secondary | ICD-10-CM | POA: Diagnosis not present

## 2018-09-17 ENCOUNTER — Other Ambulatory Visit: Payer: Self-pay | Admitting: Cardiovascular Disease

## 2018-09-17 NOTE — Telephone Encounter (Signed)
Metoprolol and atorvastatin refilled. 

## 2018-09-21 DIAGNOSIS — H43811 Vitreous degeneration, right eye: Secondary | ICD-10-CM | POA: Diagnosis not present

## 2018-09-21 DIAGNOSIS — Z09 Encounter for follow-up examination after completed treatment for conditions other than malignant neoplasm: Secondary | ICD-10-CM | POA: Diagnosis not present

## 2018-10-04 ENCOUNTER — Telehealth: Payer: Self-pay | Admitting: Cardiovascular Disease

## 2018-10-04 NOTE — Telephone Encounter (Signed)
Spoke to pt who stated he has not been having any problems and no cardiac symptoms. Pt rescheduled to 12/22/18 at 11:20 AM with Dr. Tresa Endo.

## 2018-10-06 ENCOUNTER — Telehealth: Payer: PPO | Admitting: Cardiovascular Disease

## 2018-10-27 ENCOUNTER — Other Ambulatory Visit: Payer: Self-pay | Admitting: Cardiovascular Disease

## 2018-11-04 DIAGNOSIS — E785 Hyperlipidemia, unspecified: Secondary | ICD-10-CM | POA: Diagnosis not present

## 2018-11-04 DIAGNOSIS — Z6826 Body mass index (BMI) 26.0-26.9, adult: Secondary | ICD-10-CM | POA: Diagnosis not present

## 2018-11-04 DIAGNOSIS — E063 Autoimmune thyroiditis: Secondary | ICD-10-CM | POA: Diagnosis not present

## 2018-11-04 DIAGNOSIS — R5383 Other fatigue: Secondary | ICD-10-CM | POA: Diagnosis not present

## 2018-11-04 DIAGNOSIS — E119 Type 2 diabetes mellitus without complications: Secondary | ICD-10-CM | POA: Diagnosis not present

## 2018-11-04 DIAGNOSIS — E118 Type 2 diabetes mellitus with unspecified complications: Secondary | ICD-10-CM | POA: Diagnosis not present

## 2018-11-04 DIAGNOSIS — E663 Overweight: Secondary | ICD-10-CM | POA: Diagnosis not present

## 2018-11-09 DIAGNOSIS — R6889 Other general symptoms and signs: Secondary | ICD-10-CM | POA: Diagnosis not present

## 2018-12-20 ENCOUNTER — Telehealth: Payer: Self-pay | Admitting: Cardiovascular Disease

## 2018-12-20 NOTE — Telephone Encounter (Signed)
Spoke to pt who agreed to come into the office for appointment with Dr. Claiborne Billings on Wednesday, 7/22 at 11:20 AM. Informed pt to wear a mask and that we are not allowing visitors at this time unless necessary for him. Pt verbalized thanks and stated he can come by himself.

## 2018-12-21 ENCOUNTER — Telehealth: Payer: Self-pay | Admitting: Cardiovascular Disease

## 2018-12-21 DIAGNOSIS — H2102 Hyphema, left eye: Secondary | ICD-10-CM | POA: Diagnosis not present

## 2018-12-21 DIAGNOSIS — H43811 Vitreous degeneration, right eye: Secondary | ICD-10-CM | POA: Diagnosis not present

## 2018-12-21 DIAGNOSIS — H4052X3 Glaucoma secondary to other eye disorders, left eye, severe stage: Secondary | ICD-10-CM | POA: Diagnosis not present

## 2018-12-21 DIAGNOSIS — T8529XS Other mechanical complication of intraocular lens, sequela: Secondary | ICD-10-CM | POA: Diagnosis not present

## 2018-12-21 DIAGNOSIS — H4020X2 Unspecified primary angle-closure glaucoma, moderate stage: Secondary | ICD-10-CM | POA: Diagnosis not present

## 2018-12-21 NOTE — Telephone Encounter (Signed)

## 2018-12-22 ENCOUNTER — Encounter: Payer: Self-pay | Admitting: Cardiovascular Disease

## 2018-12-22 ENCOUNTER — Other Ambulatory Visit: Payer: Self-pay

## 2018-12-22 ENCOUNTER — Ambulatory Visit: Payer: PPO | Admitting: Cardiovascular Disease

## 2018-12-22 VITALS — BP 165/78 | HR 59 | Ht 75.0 in | Wt 220.4 lb

## 2018-12-22 DIAGNOSIS — I10 Essential (primary) hypertension: Secondary | ICD-10-CM | POA: Diagnosis not present

## 2018-12-22 DIAGNOSIS — E785 Hyperlipidemia, unspecified: Secondary | ICD-10-CM

## 2018-12-22 DIAGNOSIS — E119 Type 2 diabetes mellitus without complications: Secondary | ICD-10-CM | POA: Diagnosis not present

## 2018-12-22 DIAGNOSIS — Z794 Long term (current) use of insulin: Secondary | ICD-10-CM

## 2018-12-22 DIAGNOSIS — Z86718 Personal history of other venous thrombosis and embolism: Secondary | ICD-10-CM

## 2018-12-22 NOTE — Progress Notes (Signed)
Patient ID: Jordan Goodwin, male   DOB: [birth year], 68 y.o.   MRN: [id number]                                                                                                                                                 HPI: Jordan Goodwin is a 68 y.o. male who presents to the office for a 15 month followup evaluation.  Jordan Goodwin has a history of left lower extremity DVT in April 2011 involving the left posterior tibial vein. Jordan Goodwin was treated with Lovenox and Coumadin and subsequently developed a calf hematoma leading to compartment syndrome requiring fasciotomy on 09/18/2009 and had a spontaneous pulmonary hemorrhage 09/22/2009. 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 Jordan Goodwin has history of diabetes mellitus and remotely Jordan Goodwin has had elevated PSAs felt  to be due to infection rather than cancer. Over the past year, Jordan Goodwin was taken off his Benicar 20 mg and this was substituted by his insurance company  with losartan 50 mg  titrated to 100 mg daily. Subsequently, this still did not control his blood pressure and Jordan Goodwin was switched to research and hydrochlorothiazide 300/12.5 mg.    An echo Doppler study in March 2012 with suggested mild to moderate aortic insufficiency.  Normal systolic function.  There was mild mitral regurgitation, mild tricuspid regurgitation.  Pulmonary pressure was normal.    Jordan Goodwin has a history of hypothyroidism and recently has been on Synthroid at 50 mcg.  Jordan Goodwin also has a history of hyperlipidemia, in addition to type 2 diabetes mellitus.  Laboratory last year revealed a total cholesterol of 134, HDL 43, triglycerides 48, LDL 81.  However, LDL particle number was increased at 1299.     I I last saw him in April 2019 at which time Jordan Goodwin was doing well.  Jordan Goodwin was exercising at least 3 days/week and working as an Chief Financial Officer at [employer].  Jordan Goodwin denied any chest pain, PND orthopnea or issues with leg  swelling.  C Jordan Goodwin is followed by Dr. Hilma Favors.  Jordan Goodwin has a history of diabetes mellitus for approximately 10 years.  Laboratory from November 2018 showed hemoglobin A1c elevated at 7.6.  Jordan Goodwin has a history of hyperlipidemia.  His LDL cholesterol in October 2018 was still elevated at 92.  Jordan Goodwin has hypothyroidism and is on levothyroxine 175 mcg.   Since I last saw him, Jordan Goodwin continues to do well.  Jordan Goodwin specifically denies chest pain or shortness of breath.  Jordan Goodwin is now walking daily at least a mile.  Jordan Goodwin denies any exertional dyspnea.  Jordan Goodwin denies any significant leg swelling.  Jordan Goodwin has been on irbesartan HCT 300/12.5 mg daily and just yesterday received a new 90-day prescription, and Jordan Goodwin continues to be on Toprol-XL 50 mg daily.  Jordan Goodwin is  diabetic and apparently has been taking 2000 mg of metformin at bedtime.  Jordan Goodwin continues to be on atorvastatin 40 mg for hyperlipidemia and levothyroxine for hypothyroidism.  Jordan Goodwin presents for evaluation.   Past Medical History  Diagnosis Date  . Hypertension   . Diabetes mellitus   . Mixed hyperlipidemia     low HDL  . 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 DVT (deep venous thrombosis) 08/2009    left lower extremity and had a DVT inthe left posterior tibial vein  . Murmur     Past Surgical History  Procedure Laterality Date  . Tonsillectomy  1962  . Retinal detachment surgery      x's 2  . Cataract extraction, bilateral    . 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 Knee arthroscopy  2012    left knee/torn meniscus    Allergies  Allergen Reactions  . Warfarin And Related     Compartment syndrome    Current Outpatient Prescriptions  Medication Sig Dispense Refill  . aspirin 81 MG EC tablet Take 81 mg by mouth daily. Swallow whole.      Marland Kitchen atorvastatin (LIPITOR) 20 MG tablet Take 20 mg by mouth daily.      Marland Kitchen glucosamine-chondroitin 500-400 MG tablet Take 1 tablet by mouth 2 (two) times daily.      . irbesartan (AVAPRO) 300 MG  tablet Take 1 tablet (300 mg total) by mouth at bedtime.  30 tablet  1  . levothyroxine (SYNTHROID, LEVOTHROID) 50 MCG tablet Take 50 mcg by mouth daily before breakfast.      . metoprolol succinate (TOPROL-XL) 50 MG 24 hr tablet Take 50 mg by mouth daily. Take with or immediately following a meal.      . niacin (NIASPAN) 1000 MG CR tablet TAKE 1 TABLET BY MOUTH AT BEDTIME  90 tablet  3  . sitaGLIPtin (JANUVIA) 100 MG tablet Take 100 mg by mouth daily.       No current facility-administered medications for this visit.   Socially Jordan Goodwin is married and has one child age 58. Jordan Goodwin exercises approximately 3-4 days per week. There is no tobacco history. 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   ROS General: Positive for fatigue; No fevers, chills, or night sweats;  HEENT: Negative; No changes in vision or hearing, sinus congestion, difficulty swallowing Pulmonary: Negative; No cough, wheezing, shortness of breath, hemoptysis Cardiovascular: Negative; No chest pain, presyncope, syncope, palpatations GI: Negative; No nausea, vomiting, diarrhea, or abdominal pain GU: Negative; No dysuria, hematuria, or difficulty voiding Musculoskeletal: Negative; no myalgias, joint pain, or weakness Hematologic/Oncology: Negative; no easy bruising, bleeding Endocrine: Positive for diabetes , and hypothyroidism; no heat/cold intolerance;  Neuro: Negative; no changes in balance, headaches Skin: Negative; No rashes or skin lesions Psychiatric: Negative; No behavioral problems, depression Sleep: Negative; No snoring, daytime sleepiness, hypersomnolence, bruxism, restless legs, hypnogognic hallucinations, no cataplexy Other comprehensive 14 point system review is negative.   PE BP (!) 165/78   Pulse (!) 59   Ht '6\' 3"'  (1.905 m)   Wt 220 lb 6.4 oz (100 kg)   SpO2 97%   BMI 27.55 kg/m    Repeat blood pressure by me was 142/80  Wt Readings from Last 3 Encounters:  12/22/18 220 lb 6.4 oz (100 kg)  09/08/17 229 lb  4.8 oz (104 kg)  08/04/16 232 lb (105.2 kg)   General: Alert, oriented, no  distress.  Skin: normal turgor, no rashes, warm and dry HEENT: Normocephalic, atraumatic. Pupils equal round and reactive to light; sclera anicteric; extraocular muscles intact;  Nose without nasal septal hypertrophy Mouth/Parynx benign; Mallinpatti scale 3 assessed previously, currently wearing a mask Neck: No JVD, no carotid bruits; normal carotid upstroke Lungs: clear to ausculatation and percussion; no wheezing or rales Chest wall: without tenderness to palpitation Heart: PMI not displaced, RRR, s1 s2 normal, 1/6 systolic murmur, no diastolic murmur, no rubs, gallops, thrills, or heaves Abdomen: soft, nontender; no hepatosplenomehaly, BS+; abdominal aorta nontender and not dilated by palpation. Back: no CVA tenderness Pulses 2+ Musculoskeletal: full range of motion, normal strength, no joint deformities Extremities: no clubbing cyanosis or edema, Homan's sign negative  Neurologic: grossly nonfocal; Cranial nerves grossly wnl Psychologic: Normal mood and affect   ECG (independently read by me): Sinus Bradycardia at 59 with first degree AV block PR interval 285m  April 2019 ECG (independently read by me): Normal sinus rhythm at 60 bpm.  Borderline first-degree AV block with a PR segment 206 ms.  March 2018 ECG (independently read by me): On his bradycardia with first-degree AV block with a ventricular rate of 56 bpm.  PR interval 212 ms.  Nonspecific T changes.  November 2016 ECG (independently read by me): Sinus bradycardia 59 bpm.  First degree AV block with a PR interval 208 ms.  Nonspecific T changes.  May 2015 ECG : Sinus rhythm at 59 beats per minute.  Borderline first-degree block with PR interval 206 ms.  No significant ST changes.  Prior ECG: Sinus bradycardia 55 beats per minute.  Nonspecific T changes. Normal intervals.  LABS:  BMP Latest Ref Rng & Units 10/11/2013 05/02/2013 11/09/2012  Glucose  70 - 99 mg/dL 145(H) 133(H) 150(H)  BUN 6 - 23 mg/dL '14 19 13  ' Creatinine 0.50 - 1.35 mg/dL 0.90 0.99 1.00  Sodium 135 - 145 mEq/L 141 140 141  Potassium 3.5 - 5.3 mEq/L 4.0 4.4 4.1  Chloride 96 - 112 mEq/L 105 105 104  CO2 19 - 32 mEq/L '28 25 26  ' Calcium 8.4 - 10.5 mg/dL 9.1 9.7 9.2   Hepatic Function Latest Ref Rng & Units 10/11/2013 11/14/2009 09/23/2009  Total Protein 6.0 - 8.3 g/dL 6.9 7.0 6.2  Albumin 3.5 - 5.2 g/dL 4.4 4.1 2.7(L)  AST 0 - 37 U/L 20 23 40(H)  ALT 0 - 53 U/L 17 19 66(H)  Alk Phosphatase 39 - 117 U/L 46 52 63  Total Bilirubin 0.2 - 1.2 mg/dL 0.6 0.8 0.5   CBC Latest Ref Rng & Units 10/11/2013 02/11/2010 11/14/2009  WBC 4.0 - 10.5 K/uL 4.5 5.8 4.8  Hemoglobin 13.0 - 17.0 g/dL 13.1 13.5 12.2(L)  Hematocrit 39.0 - 52.0 % 38.9(L) 41.6 37.3(L)  Platelets 150 - 400 K/uL 183 183 206   Lab Results  Component Value Date   MCV 68.7 (L) 10/11/2013   MCV 71.4 (L) 02/11/2010   MCV 73.5 (L) 11/14/2009   Lab Results  Component Value Date   TSH 6.283 (H) 11/14/2013   Lab Results  Component Value Date   HGBA1C 6.7 (H) 10/11/2013   Lipid Panel     Component Value Date/Time   CHOL 134 10/11/2013 1309   TRIG 48 10/11/2013 1309   HDL 43 10/11/2013 1309   LDLCALC 81 10/11/2013 1309    RADIOLOGY: No results found.  IMPRESSION:  1. Essential hypertension, benign   2. Hyperlipidemia with target LDL less than 70   3. Controlled  type 2 diabetes mellitus without complication, with long-term current use of insulin ([place])   4. History of DVT (deep vein thrombosis)     ASSESSMENT AND PLAN:  Mr. Mroczkowski is a 68 year old gentleman who has a history of DVT and has not had any recurrence.  Jordan Goodwin  is no longer on chronic anticoagulation, but continues to take aspirin 81 mg daily.   Jordan Goodwin denies recurrent leg swelling.  Has a history of hypertension.  Jordan Goodwin states typically his blood pressure at home runs approximately 537 systolically.  Blood pressure initially was elevated at 165/78  and on repeat by me was 142/80.  I discussed with him the new hypertensive guidelines with stage I hypertension beginning at 130/80 in stage II hypertension at 140/90.  Was going to change his irbesartan HCT from 300/12 0.5-3 100/25 mg, but since Jordan Goodwin had just renewed his prescription and has a 90-day supply I have recommended that over the next several weeks until Jordan Goodwin sees his primary physician again that Jordan Goodwin continue his current regimen.  Ice recommended significant attempt at reducing sodium intake and even further increasing exercise.  If his blood pressure is elevated when Jordan Goodwin being seen by his primary physician with systolics in the 482 range I would suggest either changing his medication to irbesartan 300/25 mg or perhaps adding another agent for more optimal blood pressure control.  Jordan Goodwin states Jordan Goodwin has regular strength metformin.  Rather than take 2 g all at once at bedtime I suggested  Jordan Goodwin take 1000 mg twice a day.  Jordan Goodwin denies any chest pain PND orthopnea.  Jordan Goodwin recently had lab work in June 2020 and lipid studies were excellent with a total cholesterol 117 and LDL cholesterol 68 with triglycerides of 66 on his current statin regimen.  Hemoglobin A1c was elevated at 7.7.  TSH was normal at 1.73.  Jordan Goodwin will follow-up with his primary MD in 1 month.  I will see him in 1 year for reevaluation or sooner if problems arise  Time spent; 25 minutes  Shelva Majestic, MD  12/22/2018  1:09 PM

## 2018-12-22 NOTE — Patient Instructions (Signed)
Medication Instructions:  The current medical regimen is effective;  continue present plan and medications.  If you need a refill on your cardiac medications before your next appointment, please call your pharmacy.   Follow-Up: At [facility], you and your health needs are our priority.  As part of our continuing mission to provide you with exceptional heart care, we have created designated Provider Care Teams.  These Care Teams include your primary Cardiologist (physician) and Advanced Practice Providers (APPs -  Physician Assistants and Nurse Practitioners) who all work together to provide you with the care you need, when you need it. You will need a follow up appointment in 12 months.  Please call our office 2 months in advance to schedule this appointment.  You may see Dr.Kelly or one of the following Advanced Practice Providers on your designated Care Team: Almyra Deforest, [place] . Fabian Sharp, PA-C  Any Other Special Instructions Will Be Listed Below (If Applicable). Monitor BP

## 2018-12-23 DIAGNOSIS — E119 Type 2 diabetes mellitus without complications: Secondary | ICD-10-CM | POA: Diagnosis not present

## 2019-02-08 DIAGNOSIS — Z6827 Body mass index (BMI) 27.0-27.9, adult: Secondary | ICD-10-CM | POA: Diagnosis not present

## 2019-02-08 DIAGNOSIS — I10 Essential (primary) hypertension: Secondary | ICD-10-CM | POA: Diagnosis not present

## 2019-02-08 DIAGNOSIS — J301 Allergic rhinitis due to pollen: Secondary | ICD-10-CM | POA: Diagnosis not present

## 2019-02-08 DIAGNOSIS — E119 Type 2 diabetes mellitus without complications: Secondary | ICD-10-CM | POA: Diagnosis not present

## 2019-03-13 ENCOUNTER — Other Ambulatory Visit: Payer: Self-pay | Admitting: Cardiovascular Disease

## 2019-04-01 DIAGNOSIS — Z79899 Other long term (current) drug therapy: Secondary | ICD-10-CM | POA: Diagnosis not present

## 2019-04-01 DIAGNOSIS — Z7984 Long term (current) use of oral hypoglycemic drugs: Secondary | ICD-10-CM | POA: Diagnosis not present

## 2019-04-01 DIAGNOSIS — E1129 Type 2 diabetes mellitus with other diabetic kidney complication: Secondary | ICD-10-CM | POA: Diagnosis not present

## 2019-04-01 DIAGNOSIS — E785 Hyperlipidemia, unspecified: Secondary | ICD-10-CM | POA: Diagnosis not present

## 2019-05-02 DIAGNOSIS — E7849 Other hyperlipidemia: Secondary | ICD-10-CM | POA: Diagnosis not present

## 2019-05-02 DIAGNOSIS — E119 Type 2 diabetes mellitus without complications: Secondary | ICD-10-CM | POA: Diagnosis not present

## 2019-05-02 DIAGNOSIS — I10 Essential (primary) hypertension: Secondary | ICD-10-CM | POA: Diagnosis not present

## 2019-05-17 DIAGNOSIS — H4052X3 Glaucoma secondary to other eye disorders, left eye, severe stage: Secondary | ICD-10-CM | POA: Diagnosis not present

## 2019-05-17 DIAGNOSIS — H2102 Hyphema, left eye: Secondary | ICD-10-CM | POA: Diagnosis not present

## 2019-05-17 DIAGNOSIS — H33059 Total retinal detachment, unspecified eye: Secondary | ICD-10-CM | POA: Diagnosis not present

## 2019-05-17 DIAGNOSIS — T8529XS Other mechanical complication of intraocular lens, sequela: Secondary | ICD-10-CM | POA: Diagnosis not present

## 2019-05-17 DIAGNOSIS — H4020X2 Unspecified primary angle-closure glaucoma, moderate stage: Secondary | ICD-10-CM | POA: Diagnosis not present

## 2019-05-25 DIAGNOSIS — R350 Frequency of micturition: Secondary | ICD-10-CM | POA: Diagnosis not present

## 2019-05-25 DIAGNOSIS — E663 Overweight: Secondary | ICD-10-CM | POA: Diagnosis not present

## 2019-05-25 DIAGNOSIS — N342 Other urethritis: Secondary | ICD-10-CM | POA: Diagnosis not present

## 2019-05-25 DIAGNOSIS — Z6827 Body mass index (BMI) 27.0-27.9, adult: Secondary | ICD-10-CM | POA: Diagnosis not present

## 2019-06-02 DIAGNOSIS — E119 Type 2 diabetes mellitus without complications: Secondary | ICD-10-CM | POA: Diagnosis not present

## 2019-06-02 DIAGNOSIS — E7849 Other hyperlipidemia: Secondary | ICD-10-CM | POA: Diagnosis not present

## 2019-06-02 DIAGNOSIS — I10 Essential (primary) hypertension: Secondary | ICD-10-CM | POA: Diagnosis not present

## 2019-06-21 ENCOUNTER — Ambulatory Visit: Payer: PPO | Admitting: Urology

## 2019-07-03 DIAGNOSIS — E119 Type 2 diabetes mellitus without complications: Secondary | ICD-10-CM | POA: Diagnosis not present

## 2019-07-03 DIAGNOSIS — E7849 Other hyperlipidemia: Secondary | ICD-10-CM | POA: Diagnosis not present

## 2019-07-03 DIAGNOSIS — I10 Essential (primary) hypertension: Secondary | ICD-10-CM | POA: Diagnosis not present

## 2019-07-07 DIAGNOSIS — Z6827 Body mass index (BMI) 27.0-27.9, adult: Secondary | ICD-10-CM | POA: Diagnosis not present

## 2019-07-07 DIAGNOSIS — E118 Type 2 diabetes mellitus with unspecified complications: Secondary | ICD-10-CM | POA: Diagnosis not present

## 2019-07-07 DIAGNOSIS — R42 Dizziness and giddiness: Secondary | ICD-10-CM | POA: Diagnosis not present

## 2019-07-07 DIAGNOSIS — Z1389 Encounter for screening for other disorder: Secondary | ICD-10-CM | POA: Diagnosis not present

## 2019-07-07 DIAGNOSIS — E119 Type 2 diabetes mellitus without complications: Secondary | ICD-10-CM | POA: Diagnosis not present

## 2019-07-07 DIAGNOSIS — E1165 Type 2 diabetes mellitus with hyperglycemia: Secondary | ICD-10-CM | POA: Diagnosis not present

## 2019-07-28 ENCOUNTER — Other Ambulatory Visit: Payer: Self-pay | Admitting: Cardiovascular Disease

## 2019-08-02 ENCOUNTER — Other Ambulatory Visit: Payer: Self-pay

## 2019-08-02 ENCOUNTER — Encounter: Payer: Self-pay | Admitting: Urology

## 2019-08-02 ENCOUNTER — Ambulatory Visit (INDEPENDENT_AMBULATORY_CARE_PROVIDER_SITE_OTHER): Payer: PPO | Admitting: Urology

## 2019-08-02 VITALS — BP 122/71 | HR 63 | Temp 97.2°F | Ht 73.0 in | Wt 205.0 lb

## 2019-08-02 DIAGNOSIS — R972 Elevated prostate specific antigen [PSA]: Secondary | ICD-10-CM

## 2019-08-02 DIAGNOSIS — N200 Calculus of kidney: Secondary | ICD-10-CM | POA: Diagnosis not present

## 2019-08-02 DIAGNOSIS — N281 Cyst of kidney, acquired: Secondary | ICD-10-CM | POA: Diagnosis not present

## 2019-08-02 DIAGNOSIS — R311 Benign essential microscopic hematuria: Secondary | ICD-10-CM

## 2019-08-02 LAB — POCT URINALYSIS DIPSTICK
Bilirubin, UA: NEGATIVE
Glucose, UA: NEGATIVE
Ketones, UA: NEGATIVE
Leukocytes, UA: NEGATIVE
Nitrite, UA: NEGATIVE
Protein, UA: POSITIVE — AB
Spec Grav, UA: >=1.03 — AB (ref 1.010–1.025)
Urobilinogen, UA: 0.2 E.U./dL
pH, UA: 5 (ref 5.0–8.0)

## 2019-08-02 LAB — PSA: PSA: 1.1 ng/mL (ref <=4.0)

## 2019-08-02 NOTE — Progress Notes (Signed)
H&P  Chief Complaint: Elevated PSA  History of Present Illness:   3.2.2021: Here today for follow-up denying any significant changes in the last year relating to his urological health. He does note that around 5 times over the last year he had some blood per stool -- he will consult his PCP about this. No complaints regarding his urinary pattern or of any urinary sx's. He has not had a recent PSA measurement.  (below copied from [facility] records):  PSA: Jordan Goodwin is a 69 year-old male established patient who is here for follow up for further evaluation of his elevated PSA.  His last PSA was performed 05/20/2016. The last PSA value was 1.1.   He initially presented in 2011 with a PSA of 5.25. This decreased to below 3 shortly after his first visit. It was felt that he had a UTI. His PSA has been normal since that time.   10.29.2019: Has not had PSA drawn recently.    05/20/16 05/23/14 05/25/12 03/17/11 10/01/10 10/01/10  PSA  Total PSA 1.1 ng/dl 8.34  1.96  2.22  9.79  5.25     Simple Renal Cyst:  The problem is on the right side. His kidney cyst was diagnosed 03/16/2018. He is not having pain. He is not currently having flank pain, back pain, groin pain, nausea, vomiting, fever or chills.   He does not have a history of urinary infections. He has not had kidney surgery.   Right renal cyst seen on recent CT scan. CT scan was performed for left-sided back pain with hematuria.  Kidney Stones: The problem is on both sides. He first stated noticing pain on approximately 01/31/2018.   The patient has a history of dipstick positive hematuria. 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 No ureteral stones. Pain was not crampy in nature. Not associated with nausea and vomiting. No associated gross hematuria.  Past Medical History:  Diagnosis Date  . Complication of  anesthesia    As a child, woke up "yelling."  . Diabetes mellitus   . DVT (deep venous thrombosis) ([facility]CC) 08/2009   left lower extremity and had a DVT inthe left posterior tibial vein  . Hypertension   . Mixed hyperlipidemia    low HDL  . Murmur   . SOB (shortness of breath) 08/01/2010   2D Echo EF >55%    Past Surgical History:  Procedure Laterality Date  . CATARACT EXTRACTION, BILATERAL    . COLONOSCOPY N/A 03/19/2015   Procedure: COLONOSCOPY;  Surgeon: Corbin Ade, MD;  Location: [facility];  Service: Endoscopy;  Laterality: N/A;  9:30 AM  . KNEE ARTHROSCOPY  2012   left knee/torn meniscus  . LEG SURGERY  2012   LLE/compartment syndrome after blood clot  . RETINAL DETACHMENT SURGERY     x's 2  . TONSILLECTOMY  1962    Home Medications:  Allergies as of 08/02/2019      Reactions   Warfarin And Related    Compartment syndrome      Medication List       Accurate as of August 02, 2019 10:20 AM. If you have any questions, ask your nurse or doctor.        aspirin 81 MG EC tablet Take 81 mg by mouth daily. Swallow whole.   atorvastatin 40 MG tablet Commonly known as: LIPITOR TAKE 1 TABLET BY MOUTH EVERY DAY  Combigan 0.2-0.5 % ophthalmic solution Generic drug: brimonidine-timolol Apply 1 drop to eye 2 (two) times daily.   cyclopentolate 1 % ophthalmic solution Commonly known as: CYCLODRYL,CYCLOGYL Place 1 drop into the left eye at bedtime.   glucosamine-chondroitin 500-400 MG tablet Take 1 tablet by mouth 2 (two) times daily.   irbesartan-hydrochlorothiazide 300-12.5 MG tablet Commonly known as: AVALIDE TAKE 1 TABLET BY MOUTH EVERY DAY   levothyroxine 200 MCG tablet Commonly known as: SYNTHROID Take 200 mcg by mouth daily. What changed: Another medication with the same name was removed. Continue taking this medication, and follow the directions you see here. Changed by: Jorja Loa, MD   metFORMIN 500 MG tablet Commonly known as:  GLUCOPHAGE Take 1 tablet by mouth daily. What changed: Another medication with the same name was removed. Continue taking this medication, and follow the directions you see here. Changed by: Jorja Loa, MD   metoprolol succinate 50 MG 24 hr tablet Commonly known as: TOPROL-XL TAKE 1 TABLET BY MOUTH EVERY DAY       Allergies:  Allergies  Allergen Reactions  . Warfarin And Related     Compartment syndrome    Family History  Problem Relation Age of Onset  . Cancer Father 33       deceased  . Heart disease Maternal Grandmother   . Heart disease Paternal Grandfather     Social History:  reports that he has never smoked. He has never used smokeless tobacco. He reports current alcohol use. He reports that he does not use drugs.  ROS: A complete review of systems was performed.  All systems are negative except for pertinent findings as noted.  Physical Exam:  Vital signs in last 24 hours: BP 122/71   Pulse 63   Temp (!) 97.2 F (36.2 C)   Ht 6\' 1"  (1.854 m)   Wt 205 lb (93 kg)   BMI 27.05 kg/m  Constitutional:  Alert and oriented, No acute distress Cardiovascular: Regular rate  Respiratory: Normal respiratory effort GI: Abdomen is soft, nontender, nondistended, no abdominal masses. No CVAT.  No hernias. Genitourinary: Normal male phallus (uncircumcised), testes are descended bilaterally and non-tender and without masses, scrotum is normal in appearance without lesions or masses, perineum is normal on inspection. Prostate feels around 60 g in size, no nodularity. No palpable hemorrhoids or masses.  Lymphatic: No lymphadenopathy Neurologic: Grossly intact, no focal deficits Psychiatric: Normal mood and affect  I have reviewed prior pt notes  I have reviewed notes from referring/previous physicians  I have reviewed urinalysis results  I have reviewed prior PSA results   Impression/Assessment:  No urinary concerns in the last year. He has not had his PSA  drawn in some time -- we will have this checked today.  UA today does have 1+ blood but this is typical for him and had negative work-up last year.   Plan:  1. Advised him to reach out to his PCP regarding his recent blood per stool to get further evaluation/work-up.   2. PSA today -- will forward results.  3. Return in 1 yr for follow-up.

## 2019-08-02 NOTE — Progress Notes (Signed)

## 2019-08-04 ENCOUNTER — Telehealth: Payer: Self-pay

## 2019-08-04 NOTE — Telephone Encounter (Signed)
-----   Message from Marcine Matar, MD sent at 08/04/2019 11:38 AM EST ----- Notify pt--psa 1.1--nml ----- Message ----- From: Ferdinand Lango, RN Sent: 08/03/2019   9:30 AM EST To: Marcine Matar, MD  Please review.

## 2019-08-04 NOTE — Telephone Encounter (Signed)
Lab result mailed to pt

## 2019-08-31 DIAGNOSIS — E7849 Other hyperlipidemia: Secondary | ICD-10-CM | POA: Diagnosis not present

## 2019-08-31 DIAGNOSIS — I10 Essential (primary) hypertension: Secondary | ICD-10-CM | POA: Diagnosis not present

## 2019-08-31 DIAGNOSIS — E119 Type 2 diabetes mellitus without complications: Secondary | ICD-10-CM | POA: Diagnosis not present

## 2019-10-10 DIAGNOSIS — Z6825 Body mass index (BMI) 25.0-25.9, adult: Secondary | ICD-10-CM | POA: Diagnosis not present

## 2019-10-10 DIAGNOSIS — Z0001 Encounter for general adult medical examination with abnormal findings: Secondary | ICD-10-CM | POA: Diagnosis not present

## 2019-10-10 DIAGNOSIS — E1129 Type 2 diabetes mellitus with other diabetic kidney complication: Secondary | ICD-10-CM | POA: Diagnosis not present

## 2019-10-10 DIAGNOSIS — E663 Overweight: Secondary | ICD-10-CM | POA: Diagnosis not present

## 2019-10-10 DIAGNOSIS — Z1389 Encounter for screening for other disorder: Secondary | ICD-10-CM | POA: Diagnosis not present

## 2019-10-10 DIAGNOSIS — Z0000 Encounter for general adult medical examination without abnormal findings: Secondary | ICD-10-CM | POA: Diagnosis not present

## 2019-10-31 DIAGNOSIS — M1991 Primary osteoarthritis, unspecified site: Secondary | ICD-10-CM | POA: Diagnosis not present

## 2019-10-31 DIAGNOSIS — I10 Essential (primary) hypertension: Secondary | ICD-10-CM | POA: Diagnosis not present

## 2019-10-31 DIAGNOSIS — E7849 Other hyperlipidemia: Secondary | ICD-10-CM | POA: Diagnosis not present

## 2019-11-15 ENCOUNTER — Ambulatory Visit (INDEPENDENT_AMBULATORY_CARE_PROVIDER_SITE_OTHER): Payer: PPO | Admitting: Ophthalmology

## 2019-11-15 ENCOUNTER — Encounter (INDEPENDENT_AMBULATORY_CARE_PROVIDER_SITE_OTHER): Payer: Self-pay | Admitting: Ophthalmology

## 2019-11-15 ENCOUNTER — Other Ambulatory Visit: Payer: Self-pay

## 2019-11-15 DIAGNOSIS — H4042X0 Glaucoma secondary to eye inflammation, left eye, stage unspecified: Secondary | ICD-10-CM | POA: Diagnosis not present

## 2019-11-15 DIAGNOSIS — T85398A Other mechanical complication of other ocular prosthetic devices, implants and grafts, initial encounter: Secondary | ICD-10-CM | POA: Insufficient documentation

## 2019-11-15 DIAGNOSIS — E119 Type 2 diabetes mellitus without complications: Secondary | ICD-10-CM

## 2019-11-15 DIAGNOSIS — H43811 Vitreous degeneration, right eye: Secondary | ICD-10-CM | POA: Diagnosis not present

## 2019-11-15 DIAGNOSIS — Z794 Long term (current) use of insulin: Secondary | ICD-10-CM

## 2019-11-15 DIAGNOSIS — H4052X3 Glaucoma secondary to other eye disorders, left eye, severe stage: Secondary | ICD-10-CM | POA: Insufficient documentation

## 2019-11-15 DIAGNOSIS — H4020X2 Unspecified primary angle-closure glaucoma, moderate stage: Secondary | ICD-10-CM | POA: Insufficient documentation

## 2019-11-15 DIAGNOSIS — H209 Unspecified iridocyclitis: Secondary | ICD-10-CM

## 2019-11-15 DIAGNOSIS — H402222 Chronic angle-closure glaucoma, left eye, moderate stage: Secondary | ICD-10-CM

## 2019-11-15 NOTE — Assessment & Plan Note (Addendum)
patient has a history of uveitis-glaucoma-edema syndrome left eye which was not treatable with medication only.  Multiple recurrences of the passive microhyphema led to anterior chamber and vitreous washout and finally to a laser tenotomy over the site of haptic, iris abrasion ongoing.  Now patient continues to do quite well on home atropine and still on Combigan.  Notably the intraocular pressure now in the left eye is slightly lower than the right eye which could suggest that the ongoing microinflammatory effect of IOL haptic iris chafe has abated  The left eye over the site of the iris, haptic shape was performed February 2020.  Since that time microhyphemas have halted, no blurred vision has occurred  Patient could consider halting or intermittent use of homatropine should he choose this

## 2019-11-15 NOTE — Progress Notes (Signed)
11/15/2019     CHIEF COMPLAINT Patient presents for Retina Follow Up   HISTORY OF PRESENT ILLNESS: Jordan Goodwin is a 69 y.o. male who presents to the clinic today for:   HPI    Retina Follow Up    Patient presents with  Other.  In both eyes.  This started 6 months ago.  Severity is mild.  Duration of 6 months.  Since onset it is stable.          Comments    6 Month F/U OU patient has a history of uveitis-glaucoma-edema syndrome left eye which was not treatable with medication only.  Multiple recurrences of the passive microhyphema led to anterior chamber and vitreous washout and finally to a laser tenotomy over the site of haptic, iris abrasion ongoing.  Now patient continues to do quite well on home atropine and still on Combigan.  Notably the intraocular pressure now in the left eye is slightly lower than the right eye which could suggest that the ongoing microinflammatory effect of IOL haptic iris chafe has abated  Pt denies noticeable changes to [place] OU since last visit. Pt denies ocular pain, flashes of light, or floaters OU.  LBS: 149 in the AM average A1c: 6.8, 10/2019       Last edited by Hurman Horn, MD on 11/15/2019 11:43 AM. (History)      Referring physician: Sharilyn Sites, MD [address] [place],  [place] [address]  HISTORICAL INFORMATION:   Selected notes from the MEDICAL RECORD NUMBER    Lab Results  Component Value Date   HGBA1C 6.7 (H) 10/11/2013     CURRENT MEDICATIONS: Current Outpatient Medications (Ophthalmic Drugs)  Medication Sig  . COMBIGAN 0.2-0.5 % ophthalmic solution Apply 1 drop to eye 2 (two) times daily.  . cyclopentolate (CYCLODRYL,CYCLOGYL) 1 % ophthalmic solution Place 1 drop into the left eye at bedtime.   No current facility-administered medications for this visit. (Ophthalmic Drugs)   Current Outpatient Medications (Other)  Medication Sig  . aspirin 81 MG EC tablet Take 81 mg by mouth daily. Swallow whole.  Marland Kitchen  atorvastatin (LIPITOR) 40 MG tablet TAKE 1 TABLET BY MOUTH EVERY DAY  . glucosamine-chondroitin 500-400 MG tablet Take 1 tablet by mouth 2 (two) times daily.  . irbesartan-hydrochlorothiazide (AVALIDE) 300-12.5 MG tablet TAKE 1 TABLET BY MOUTH EVERY DAY  . levothyroxine (SYNTHROID) 200 MCG tablet Take 200 mcg by mouth daily.  . metFORMIN (GLUCOPHAGE) 500 MG tablet Take 1 tablet by mouth daily.  . metoprolol succinate (TOPROL-XL) 50 MG 24 hr tablet TAKE 1 TABLET BY MOUTH EVERY DAY   No current facility-administered medications for this visit. (Other)      REVIEW OF SYSTEMS:    ALLERGIES Allergies  Allergen Reactions  . Warfarin And Related     Compartment syndrome    PAST MEDICAL HISTORY Past Medical History:  Diagnosis Date  . Complication of anesthesia    As a child, woke up "yelling."  . Diabetes mellitus   . DVT (deep venous thrombosis) ([facility]) 08/2009   left lower extremity and had a DVT inthe left posterior tibial vein  . Hypertension   . Mixed hyperlipidemia    low HDL  . Murmur   . SOB (shortness of breath) 08/01/2010   2D Echo EF >55%   Past Surgical History:  Procedure Laterality Date  . CATARACT EXTRACTION, BILATERAL    . COLONOSCOPY N/A 03/19/2015   Procedure: COLONOSCOPY;  Surgeon: Daneil Dolin, MD;  Location: [facility];  Service: Endoscopy;  Laterality: N/A;  9:30 AM  . KNEE ARTHROSCOPY  2012   left knee/torn meniscus  . LEG SURGERY  2012   LLE/compartment syndrome after blood clot  . RETINAL DETACHMENT SURGERY     x's 2  . TONSILLECTOMY  1962    FAMILY HISTORY Family History  Problem Relation Age of Onset  . Cancer Father 24       deceased  . Heart disease Maternal Grandmother   . Heart disease Paternal Grandfather     SOCIAL HISTORY Social History   Tobacco Use  . Smoking status: Never Smoker  . Smokeless tobacco: Never Used  Substance Use Topics  . Alcohol use: Yes    Comment: occasionally drinks beer,whiskey  . Drug use: No          OPHTHALMIC EXAM:  Base Eye Exam    Visual Acuity (ETDRS)      Right Left   Dist cc 20/20 -2 20/20 -1   Correction: Glasses       Tonometry (Tonopen, 10:49 AM)      Right Left   Pressure 15 13       Pupils      Dark Light Shape React APD   Right 3 2 Round Brisk None   Left 6 6 Round dilated None       Visual Fields (Counting fingers)      Left Right    Full Full       Extraocular Movement      Right Left    Full Full       Neuro/Psych    Oriented x3: Yes   Mood/Affect: Normal       Dilation    Both eyes: 1.0% Mydriacyl, 2.5% Phenylephrine @ 10:49 AM        Slit Lamp and Fundus Exam    External Exam      Right Left   External Normal Normal       Slit Lamp Exam      Right Left   Lids/Lashes Normal Normal   Conjunctiva/Sclera White and quiet White and quiet   Cornea Clear Clear   Anterior Chamber Deep and quiet Deep and quiet   Iris Round and reactive PI at 6, patent   Lens Centered posterior chamber intraocular lens Centered posterior chamber intraocular lens   Anterior Vitreous Normal Normal       Fundus Exam      Right Left   Posterior Vitreous Clear vitrectomized Clear vitrectomized   Disc Normal Normal   C/D Ratio 0.5 0.45   Macula Normal    Vessels Normal    Periphery Good scleral buckle, retinal cryopexy a attached Normal          IMAGING AND PROCEDURES  Imaging and Procedures for 11/15/19  Color Fundus Photography Optos - OU - Both Eyes       Right Eye Progression has been stable. Disc findings include normal observations. Macula : normal observations.   Left Eye Progression has been stable. Disc findings include normal observations. Macula : normal observations. Vessels : normal observations.   Notes Good scleral buckle, clear media OD.  OS, good retinopexy 360 good laser photocoagulation                ASSESSMENT/PLAN:  Uveitis-hyphema-glaucoma syndrome of left eye patient has a history of  uveitis-glaucoma-edema syndrome left eye which was not treatable with medication only.  Multiple recurrences of the passive microhyphema  led to anterior chamber and vitreous washout and finally to a laser tenotomy over the site of haptic, iris abrasion ongoing.  Now patient continues to do quite well on home atropine and still on Combigan.  Notably the intraocular pressure now in the left eye is slightly lower than the right eye which could suggest that the ongoing microinflammatory effect of IOL haptic iris chafe has abated  The left eye over the site of the iris, haptic shape was performed February 2020.  Since that time microhyphemas have halted, no blurred vision has occurred  Patient could consider halting or intermittent use of homatropine should he choose this      ICD-10-CM   1. Posterior vitreous detachment of right eye  H43.811 Color Fundus Photography Optos - OU - Both Eyes  2. 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 Uveitis-hyphema-glaucoma syndrome of left eye  T85.398A    H20.9    H40.42X0   4. Controlled type 2 diabetes mellitus without complication, with long-term current use of insulin (HCC)  E11.9    Z79.4     1.  Continue Combigan for residual side effects of the glaucoma portion of the UGH syndrome for syndrome OS.  2. OK to refill Combigan and homatropine for OS use chronic  3.  Ophthalmic Meds Ordered this visit:  No orders of the defined types were placed in this encounter.      Return in about 9 months (around 08/14/2020) for DILATE OU, COLOR FP.  There are no Patient Instructions on file for this visit.   Explained the diagnoses, plan, and follow up with the patient and they expressed understanding.  Patient expressed understanding of the importance of proper follow up care.   Alford Highland Ezmae Speers M.D. Diseases & Surgery of the Retina and Vitreous [facility] 11/15/19     Abbreviations: M myopia  (nearsighted); A astigmatism; H hyperopia (farsighted); P presbyopia; Mrx spectacle prescription;  CTL contact lenses; OD right eye; OS left eye; OU both eyes  XT exotropia; ET esotropia; PEK punctate epithelial keratitis; PEE punctate epithelial erosions; DES dry eye syndrome; MGD meibomian gland dysfunction; ATs artificial tears; PFAT's preservative free artificial tears; NSC nuclear sclerotic cataract; PSC posterior subcapsular cataract; ERM epi-retinal membrane; PVD posterior vitreous detachment; RD retinal detachment; DM diabetes mellitus; DR diabetic retinopathy; NPDR non-proliferative diabetic retinopathy; PDR proliferative diabetic retinopathy; CSME clinically significant macular edema; DME diabetic macular edema; dbh dot blot hemorrhages; CWS cotton wool spot; POAG primary open angle glaucoma; C/D cup-to-disc ratio; HVF humphrey visual field; GVF goldmann visual field; OCT optical coherence tomography; IOP intraocular pressure; BRVO Branch retinal vein occlusion; CRVO central retinal vein occlusion; CRAO central retinal artery occlusion; BRAO branch retinal artery occlusion; RT retinal tear; SB scleral buckle; PPV pars plana vitrectomy; VH Vitreous hemorrhage; PRP panretinal laser photocoagulation; IVK intravitreal kenalog; VMT vitreomacular traction; MH Macular hole;  NVD neovascularization of the disc; NVE neovascularization elsewhere; AREDS age related eye disease study; ARMD age related macular degeneration; POAG primary open angle glaucoma; EBMD epithelial/anterior basement membrane dystrophy; ACIOL anterior chamber intraocular lens; IOL intraocular lens; PCIOL posterior chamber intraocular lens; Phaco/IOL phacoemulsification with intraocular lens placement; PRK photorefractive keratectomy; LASIK laser assisted in situ keratomileusis; HTN hypertension; DM diabetes mellitus; COPD chronic obstructive pulmonary disease

## 2020-01-23 ENCOUNTER — Ambulatory Visit: Payer: PPO | Admitting: Cardiovascular Disease

## 2020-01-23 ENCOUNTER — Encounter: Payer: Self-pay | Admitting: Cardiovascular Disease

## 2020-01-23 ENCOUNTER — Other Ambulatory Visit: Payer: Self-pay

## 2020-01-23 VITALS — BP 140/82 | HR 54 | Ht 75.0 in | Wt 199.4 lb

## 2020-01-23 DIAGNOSIS — E119 Type 2 diabetes mellitus without complications: Secondary | ICD-10-CM | POA: Diagnosis not present

## 2020-01-23 DIAGNOSIS — E785 Hyperlipidemia, unspecified: Secondary | ICD-10-CM | POA: Diagnosis not present

## 2020-01-23 DIAGNOSIS — I10 Essential (primary) hypertension: Secondary | ICD-10-CM

## 2020-01-23 DIAGNOSIS — Z794 Long term (current) use of insulin: Secondary | ICD-10-CM | POA: Diagnosis not present

## 2020-01-23 DIAGNOSIS — I440 Atrioventricular block, first degree: Secondary | ICD-10-CM | POA: Diagnosis not present

## 2020-01-23 DIAGNOSIS — Z86718 Personal history of other venous thrombosis and embolism: Secondary | ICD-10-CM | POA: Diagnosis not present

## 2020-01-23 NOTE — Progress Notes (Signed)
Patient ID: Jordan Goodwin, male   DOB: September 21, 1950, 69 y.o.   MRN: [id number]                                                                                                                                                 HPI: Jordan Goodwin is a 69 y.o. male who presents to the office for a 13 month followup evaluation.  Jordan Goodwin has a history of left lower extremity DVT in April 2011 involving the left posterior tibial vein. He was treated with Lovenox and Coumadin and subsequently developed a calf hematoma leading to compartment syndrome requiring fasciotomy on 09/18/2009 and had a spontaneous pulmonary hemorrhage 09/22/2009. 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 He has history of diabetes mellitus and remotely he has had elevated PSAs felt  to be due to infection rather than cancer. Over the past year, he was taken off his Benicar 20 mg and this was substituted by his insurance company  with losartan 50 mg  titrated to 100 mg daily. Subsequently, this still did not control his blood pressure and he was switched to research and hydrochlorothiazide 300/12.5 mg.    An echo Doppler study in March 2012 with suggested mild to moderate aortic insufficiency.  Normal systolic function.  There was mild mitral regurgitation, mild tricuspid regurgitation.  Pulmonary pressure was normal.    He has a history of hypothyroidism and recently has been on Synthroid at 50 mcg.  He also has a history of hyperlipidemia, in addition to type 2 diabetes mellitus.  Laboratory last year revealed a total cholesterol of 134, HDL 43, triglycerides 48, LDL 81.  However, LDL particle number was increased at 1299.     When I saw him in April 2019  he was doing well.  He was exercising at least 3 days/week and working as an Chief Financial Officer at [employer].  He denied any chest pain, PND orthopnea or issues with leg swelling.  He is  followed by Dr. Hilma Favors.  He has a history of diabetes mellitus for approximately 10 years.  Laboratory from November 2018 showed hemoglobin A1c elevated at 7.6.  He has a history of hyperlipidemia.  His LDL cholesterol in October 2018 was still elevated at 92.  He has hypothyroidism and is on levothyroxine 175 mcg.   I last saw him in July 2020 at which time he continued to do well.  He specifically denied any  chest pain or shortness of breath.  He was walking daily at least a mile.  He denies any exertional dyspnea.  He denies any significant leg swelling.  He was on irbesartan HCT 300/12.5 mg daily and Toprol-XL 50 mg daily.  He was diabetic on metformin, and continue to be on atorvastatin 40 mg for  hyperlipidemia and levothyroxine for hypothyroidism.   Since I last saw him, he has continued to do well.  He is now retired and retired shortly before returning 53.  He has not been able to travel or go to the gym due to the COVID-19 pandemic but remains active.  He continues to be on irbesartan HCT 300/12.5 mg in addition to Toprol-XL 50 mg.  Blood pressure at home has been stable.  He is unaware of palpitations or chest pain.  He continues to be on levothyroxine 200 mcg for hypothyroidism, Metformin now at 500 mg for diabetes and atorvastatin 40 mg.  Laboratory by Dr. Sharilyn Sites on Oct 10, 2019 showed an LDL cholesterol at 69.  Hemoglobin A1c was 6.8.  TSH in June 2020 was 1.73.  He is followed by Dr. Franchot Gallo for urologic health.  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  He presents for yearly evaluation.   Past Medical History  Diagnosis Date  . Hypertension   . Diabetes mellitus   . Mixed hyperlipidemia     low HDL  . 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 DVT (deep venous thrombosis) 08/2009    left lower extremity and had a DVT inthe left posterior tibial vein  . Murmur      Past Surgical History  Procedure Laterality Date  . Tonsillectomy  1962  . Retinal detachment surgery      x's 2  . Cataract extraction, bilateral    . 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 Knee arthroscopy  2012    left knee/torn meniscus    Allergies  Allergen Reactions  . Warfarin And Related     Compartment syndrome    Current Outpatient Prescriptions  Medication Sig Dispense Refill  . aspirin 81 MG EC tablet Take 81 mg by mouth daily. Swallow whole.      Marland Kitchen atorvastatin (LIPITOR) 20 MG tablet Take 20 mg by mouth daily.      Marland Kitchen glucosamine-chondroitin 500-400 MG tablet Take 1 tablet by mouth 2 (two) times daily.      . irbesartan (AVAPRO) 300 MG tablet Take 1 tablet (300 mg total) by mouth at bedtime.  30 tablet  1  . levothyroxine (SYNTHROID, LEVOTHROID) 50 MCG tablet Take 50 mcg by mouth daily before breakfast.      . metoprolol succinate (TOPROL-XL) 50 MG 24 hr tablet Take 50 mg by mouth daily. Take with or immediately following a meal.      . niacin (NIASPAN) 1000 MG CR tablet TAKE 1 TABLET BY MOUTH AT BEDTIME  90 tablet  3  . sitaGLIPtin (JANUVIA) 100 MG tablet Take 100 mg by mouth daily.       No current facility-administered medications for this visit.   Socially he is married and has one child age 61. He exercises approximately 3-4 days per week. There is no tobacco history. He works as an Chief Financial Officer for [employer].   ROS General: Positive for fatigue; No fevers, chills, or night sweats;  HEENT: Negative; No changes in vision or hearing, sinus congestion, difficulty swallowing Pulmonary: Negative; No cough, wheezing, shortness of breath, hemoptysis Cardiovascular: Negative; No chest pain, presyncope, syncope, palpatations GI: Negative; No nausea, vomiting, diarrhea, or abdominal pain GU: Negative; No dysuria, hematuria, or difficulty voiding Musculoskeletal: Negative; no myalgias, joint pain, or weakness Hematologic/Oncology:  Negative;  no easy bruising, bleeding Endocrine: Positive for diabetes , and hypothyroidism; no heat/cold intolerance;  Neuro: Negative; no changes in balance, headaches Skin: Negative; No rashes or skin lesions Psychiatric: Negative; No behavioral problems, depression Sleep: Negative; No snoring, daytime sleepiness, hypersomnolence, bruxism, restless legs, hypnogognic hallucinations, no cataplexy Other comprehensive 14 point system review is negative.   PE BP 140/82   Pulse (!) 54   Ht '6\' 3"'  (1.905 m)   Wt 199 lb 6.4 oz (90.4 kg)   SpO2 99%   BMI 24.92 kg/m    Repeat blood pressure was improved by me at 128-130/76.  Wt Readings from Last 3 Encounters:  01/23/20 199 lb 6.4 oz (90.4 kg)  08/02/19 205 lb (93 kg)  12/22/18 220 lb 6.4 oz (100 kg)     Physical Exam BP 140/82   Pulse (!) 54   Ht '6\' 3"'  (1.905 m)   Wt 199 lb 6.4 oz (90.4 kg)   SpO2 99%   BMI 24.92 kg/m  General: Alert, oriented, no distress.  Skin: normal turgor, no rashes, warm and dry HEENT: Normocephalic, atraumatic. Pupils equal round and reactive to light; sclera anicteric; extraocular muscles intact;  Nose without nasal septal hypertrophy Mouth/Parynx benign; Mallinpatti scale 3 Neck: No JVD, no carotid bruits; normal carotid upstroke Lungs: clear to ausculatation and percussion; no wheezing or rales Chest wall: without tenderness to palpitation Heart: PMI not displaced, RRR, s1 s2 normal, 1/6 systolic murmur, no diastolic murmur, no rubs, gallops, thrills, or heaves Abdomen: soft, nontender; no hepatosplenomehaly, BS+; abdominal aorta nontender and not dilated by palpation. Back: no CVA tenderness Pulses 2+ Musculoskeletal: full range of motion, normal strength, no joint deformities Extremities: no clubbing cyanosis or edema, Homan's sign negative  Neurologic: grossly nonfocal; Cranial nerves grossly wnl Psychologic: Normal mood and affect  ECG (independently read by me): Sinus bradycardia 54 bpm.   Nonspecific T wave abnormality.  QT TC interval 396 ms.  PR interval 202 ms.  July 2020 ECG (independently read by me): Sinus Bradycardia at 59 with first degree AV block PR interval 218m  April 2019 ECG (independently read by me): Normal sinus rhythm at 60 bpm.  Borderline first-degree AV block with a PR segment 206 ms.  March 2018 ECG (independently read by me): On his bradycardia with first-degree AV block with a ventricular rate of 56 bpm.  PR interval 212 ms.  Nonspecific T changes.  November 2016 ECG (independently read by me): Sinus bradycardia 59 bpm.  First degree AV block with a PR interval 208 ms.  Nonspecific T changes.  May 2015 ECG : Sinus rhythm at 59 beats per minute.  Borderline first-degree block with PR interval 206 ms.  No significant ST changes.  Prior ECG: Sinus bradycardia 55 beats per minute.  Nonspecific T changes. Normal intervals.  LABS:  BMP Latest Ref Rng & Units 10/11/2013 05/02/2013 11/09/2012  Glucose 70 - 99 mg/dL 145(H) 133(H) 150(H)  BUN 6 - 23 mg/dL '14 19 13  ' Creatinine 0.50 - 1.35 mg/dL 0.90 0.99 1.00  Sodium 135 - 145 mEq/L 141 140 141  Potassium 3.5 - 5.3 mEq/L 4.0 4.4 4.1  Chloride 96 - 112 mEq/L 105 105 104  CO2 19 - 32 mEq/L '28 25 26  ' Calcium 8.4 - 10.5 mg/dL 9.1 9.7 9.2   Hepatic Function Latest Ref Rng & Units 10/11/2013 11/14/2009 09/23/2009  Total Protein 6.0 - 8.3 g/dL 6.9 7.0 6.2  Albumin 3.5 - 5.2 g/dL 4.4 4.1 2.7(L)  AST 0 - 37  U/L 20 23 40(H)  ALT 0 - 53 U/L 17 19 66(H)  Alk Phosphatase 39 - 117 U/L 46 52 63  Total Bilirubin 0.2 - 1.2 mg/dL 0.6 0.8 0.5   CBC Latest Ref Rng & Units 10/11/2013 02/11/2010 11/14/2009  WBC 4.0 - 10.5 K/uL 4.5 5.8 4.8  Hemoglobin 13.0 - 17.0 g/dL 13.1 13.5 12.2(L)  Hematocrit 39 - 52 % 38.9(L) 41.6 37.3(L)  Platelets 150 - 400 K/uL 183 183 206   Lab Results  Component Value Date   MCV 68.7 (L) 10/11/2013   MCV 71.4 (L) 02/11/2010   MCV 73.5 (L) 11/14/2009   Lab Results  Component Value Date   TSH  6.283 (H) 11/14/2013   Lab Results  Component Value Date   HGBA1C 6.7 (H) 10/11/2013   Lipid Panel     Component Value Date/Time   CHOL 134 10/11/2013 1309   TRIG 48 10/11/2013 1309   HDL 43 10/11/2013 1309   LDLCALC 81 10/11/2013 1309    RADIOLOGY: No results found.  IMPRESSION:  1. Essential hypertension, benign   2. Hyperlipidemia with target LDL less than 70   3. History of DVT (deep vein thrombosis)   4. Controlled type 2 diabetes mellitus without complication, with long-term current use of insulin ([place])   5. Borderline first degree AV block     ASSESSMENT AND PLAN:  Mr. Ferrante is a 23 -year-old gentleman who has a history of DVT and has not had any recurrence.  He  is no longer on chronic anticoagulation, but continues to take aspirin 81 mg daily.   He denies recurrent leg swelling.  He has a history of hypertension and blood pressures have been relatively well controlled on his combination therapy consisting of irbesartan HCT 300/12.5 mg in addition to metoprolol succinate 50 mg daily.  PR interval is 202 ms consistent with borderline first-degree AV block.  There are no signs of edema.  He denies any chest pain or shortness of breath.  He continues to be on atorvastatin 40 mg daily and LDL cholesterol in May 2021 was excellent at 19.  Triglycerides were 62 HDL 40 and total cholesterol 122.  He is on levothyroxine for hypothyroidism with TSH 1.7 in June 2020.  He is diabetic on Metformin with hemoglobin A1c 6.8 in May 2021.  He remains active.  He denies exertional shortness of breath or palpitations.  There is no edema.  He will continue current regimen.  I will see him in 1 year for reevaluation or sooner as needed.   Shelva Majestic, MD  01/23/2020  10:54 AM

## 2020-01-23 NOTE — Patient Instructions (Signed)

## 2020-01-26 ENCOUNTER — Other Ambulatory Visit: Payer: Self-pay | Admitting: Cardiovascular Disease

## 2020-01-26 NOTE — Telephone Encounter (Signed)
Rx has been sent to the pharmacy electronically. ° °

## 2020-01-31 DIAGNOSIS — M1991 Primary osteoarthritis, unspecified site: Secondary | ICD-10-CM | POA: Diagnosis not present

## 2020-01-31 DIAGNOSIS — I10 Essential (primary) hypertension: Secondary | ICD-10-CM | POA: Diagnosis not present

## 2020-01-31 DIAGNOSIS — E7849 Other hyperlipidemia: Secondary | ICD-10-CM | POA: Diagnosis not present

## 2020-03-01 ENCOUNTER — Other Ambulatory Visit: Payer: Self-pay | Admitting: Cardiovascular Disease

## 2020-03-01 DIAGNOSIS — I10 Essential (primary) hypertension: Secondary | ICD-10-CM | POA: Diagnosis not present

## 2020-03-01 DIAGNOSIS — E7849 Other hyperlipidemia: Secondary | ICD-10-CM | POA: Diagnosis not present

## 2020-03-01 DIAGNOSIS — E119 Type 2 diabetes mellitus without complications: Secondary | ICD-10-CM | POA: Diagnosis not present

## 2020-05-01 DIAGNOSIS — M720 Palmar fascial fibromatosis [Dupuytren]: Secondary | ICD-10-CM | POA: Diagnosis not present

## 2020-05-01 DIAGNOSIS — M79642 Pain in left hand: Secondary | ICD-10-CM | POA: Diagnosis not present

## 2020-05-01 DIAGNOSIS — M79641 Pain in right hand: Secondary | ICD-10-CM | POA: Diagnosis not present

## 2020-06-01 DIAGNOSIS — E7849 Other hyperlipidemia: Secondary | ICD-10-CM | POA: Diagnosis not present

## 2020-06-01 DIAGNOSIS — I10 Essential (primary) hypertension: Secondary | ICD-10-CM | POA: Diagnosis not present

## 2020-06-01 DIAGNOSIS — E119 Type 2 diabetes mellitus without complications: Secondary | ICD-10-CM | POA: Diagnosis not present

## 2020-06-13 DIAGNOSIS — Z1331 Encounter for screening for depression: Secondary | ICD-10-CM | POA: Diagnosis not present

## 2020-06-13 DIAGNOSIS — H9319 Tinnitus, unspecified ear: Secondary | ICD-10-CM | POA: Diagnosis not present

## 2020-06-13 DIAGNOSIS — E663 Overweight: Secondary | ICD-10-CM | POA: Diagnosis not present

## 2020-06-13 DIAGNOSIS — Z6825 Body mass index (BMI) 25.0-25.9, adult: Secondary | ICD-10-CM | POA: Diagnosis not present

## 2020-06-13 DIAGNOSIS — E119 Type 2 diabetes mellitus without complications: Secondary | ICD-10-CM | POA: Diagnosis not present

## 2020-06-14 DIAGNOSIS — E119 Type 2 diabetes mellitus without complications: Secondary | ICD-10-CM | POA: Diagnosis not present

## 2020-06-14 DIAGNOSIS — E1129 Type 2 diabetes mellitus with other diabetic kidney complication: Secondary | ICD-10-CM | POA: Diagnosis not present

## 2020-06-26 ENCOUNTER — Other Ambulatory Visit (INDEPENDENT_AMBULATORY_CARE_PROVIDER_SITE_OTHER): Payer: Self-pay | Admitting: Ophthalmology

## 2020-06-26 MED ORDER — CYCLOPENTOLATE HCL 1 % OP SOLN: 1.0000 [drp] | Freq: Every day | OPHTHALMIC | 12 refills | Status: DC

## 2020-07-02 DIAGNOSIS — E7849 Other hyperlipidemia: Secondary | ICD-10-CM | POA: Diagnosis not present

## 2020-07-02 DIAGNOSIS — I10 Essential (primary) hypertension: Secondary | ICD-10-CM | POA: Diagnosis not present

## 2020-07-02 DIAGNOSIS — E119 Type 2 diabetes mellitus without complications: Secondary | ICD-10-CM | POA: Diagnosis not present

## 2020-07-14 ENCOUNTER — Other Ambulatory Visit (INDEPENDENT_AMBULATORY_CARE_PROVIDER_SITE_OTHER): Payer: Self-pay | Admitting: Ophthalmology

## 2020-07-16 ENCOUNTER — Other Ambulatory Visit (INDEPENDENT_AMBULATORY_CARE_PROVIDER_SITE_OTHER): Payer: Self-pay | Admitting: Ophthalmology

## 2020-07-17 ENCOUNTER — Other Ambulatory Visit (INDEPENDENT_AMBULATORY_CARE_PROVIDER_SITE_OTHER): Payer: Self-pay | Admitting: Ophthalmology

## 2020-08-06 NOTE — Progress Notes (Signed)
History of Present Illness:   He initially presented in 2011 with a PSA of 5.25. This decreased to below 3 shortly after his first visit. It was felt that he had a UTI. His PSA has been normal since that time.  3.8.2022: No real urinary symptoms over the past year.  IPSS 5, quality-of-life score 1.  He is not on any urologic medications.  He has not had urinary infections over the past year.  PSA checked last year in March 2021 was 1.1.     05/20/16 05/23/14 05/25/12 03/17/11 10/01/10 10/01/10  PSA  Total PSA 1.1 ng/dl 4.09  7.35  3.29  9.24  5.25     Simple Renal Cyst:  The problem is on the right side. His kidney cyst was diagnosed 03/16/2018. He is not having pain. He is not currently having flank pain, back pain, groin pain, nausea, vomiting, fever or chills.   He does not have a history of urinary infections. He has not had kidney surgery.   Right renal cyst seen on recent CT scan. CT scan was performed for left-sided back pain with hematuria.  Kidney Stones:    The patient has a history of dipstick positive hematuria. 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 No ureteral stones. Pain was not crampy in nature. Not associated with nausea and vomiting. No associated gross hematuria.   Past Medical History:  Diagnosis Date  . Complication of anesthesia    As a child, woke up "yelling."  . Diabetes mellitus   . DVT (deep venous thrombosis) ([facility]) 08/2009   left lower extremity and had a DVT inthe left posterior tibial vein  . Hypertension   . Mixed hyperlipidemia    low HDL  . Murmur   . SOB (shortness of breath) 08/01/2010   2D Echo EF >55%    Past Surgical History:  Procedure Laterality Date  . CATARACT EXTRACTION, BILATERAL    . COLONOSCOPY N/A 03/19/2015   Procedure: COLONOSCOPY;  Surgeon: Corbin Ade, MD;  Location: [facility];   Service: Endoscopy;  Laterality: N/A;  9:30 AM  . KNEE ARTHROSCOPY  2012   left knee/torn meniscus  . LEG SURGERY  2012   LLE/compartment syndrome after blood clot  . RETINAL DETACHMENT SURGERY     x's 2  . TONSILLECTOMY  1962    Home Medications:  Allergies as of 08/07/2020      Reactions   Warfarin And Related    Compartment syndrome      Medication List       Accurate as of August 06, 2020  8:07 PM. If you have any questions, ask your nurse or doctor.        aspirin 81 MG EC tablet Take 81 mg by mouth daily. Swallow whole.   atorvastatin 40 MG tablet Commonly known as: LIPITOR TAKE 1 TABLET BY MOUTH EVERY DAY   brimonidine-timolol 0.2-0.5 % ophthalmic solution Commonly known as: COMBIGAN INSTILL 1 DROP INTO BOTH EYES TWICE A DAY   cyclopentolate 1 % ophthalmic solution Commonly known as: CYCLODRYL,CYCLOGYL Place 1 drop into the left eye at bedtime.   glucosamine-chondroitin 500-400 MG tablet Take 1 tablet by mouth 2 (two) times daily.   irbesartan-hydrochlorothiazide 300-12.5 MG tablet Commonly known as: AVALIDE TAKE 1 TABLET BY MOUTH EVERY DAY   levothyroxine 200 MCG tablet Commonly known as: SYNTHROID Take 200 mcg by mouth daily.  metFORMIN 500 MG tablet Commonly known as: GLUCOPHAGE Take 1 tablet by mouth daily.   metoprolol succinate 50 MG 24 hr tablet Commonly known as: TOPROL-XL TAKE 1 TABLET BY MOUTH EVERY DAY       Allergies:  Allergies  Allergen Reactions  . Warfarin And Related     Compartment syndrome    Family History  Problem Relation Age of Onset  . Cancer Father 73       deceased  . Heart disease Maternal Grandmother   . Heart disease Paternal Grandfather     Social History:  reports that he has never smoked. He has never used smokeless tobacco. He reports current alcohol use. He reports that he does not use drugs.  ROS: A complete review of systems was performed.  All systems are negative except for pertinent findings as  noted.  Physical Exam:  Vital signs in last 24 hours: There were no vitals taken for this visit. Constitutional:  Alert and oriented, No acute distress Cardiovascular: Regular rate  Respiratory: Normal respiratory effort GI: Abdomen is soft, nontender, nondistended, no abdominal masses. No CVAT.  Genitourinary: Normal male phallus/uncircumcised, testes are descended bilaterally and non-tender and without masses, scrotum is normal in appearance without lesions or masses, perineum is normal on inspection.  Prostate 30 g in size, symmetrical, nonnodular, nontender. Lymphatic: No lymphadenopathy Neurologic: Grossly intact, no focal deficits Psychiatric: Normal mood and affect  I have reviewed prior pt notes  I have reviewed notes from referring/previous physicians  I have reviewed urinalysis results  I have reviewed prior PSA results       Impression/Assessment:  1.  History of elevated PSA with normal trend recently  2.  Here for screening prostate exam  Plan:  1.  PSA will be checked today  2.  Patient requests annual checks.  I will bring him back in a year.

## 2020-08-07 ENCOUNTER — Other Ambulatory Visit: Payer: Self-pay

## 2020-08-07 ENCOUNTER — Encounter: Payer: Self-pay | Admitting: Urology

## 2020-08-07 ENCOUNTER — Ambulatory Visit (INDEPENDENT_AMBULATORY_CARE_PROVIDER_SITE_OTHER): Payer: PPO | Admitting: Urology

## 2020-08-07 VITALS — BP 159/70 | HR 61 | Ht 75.0 in | Wt 200.0 lb

## 2020-08-07 DIAGNOSIS — R972 Elevated prostate specific antigen [PSA]: Secondary | ICD-10-CM

## 2020-08-07 NOTE — Progress Notes (Signed)
Urological Symptom Review  Patient is experiencing the following symptoms: None   Review of Systems  Gastrointestinal (upper)  : Negative for upper GI symptoms  Gastrointestinal (lower) : Negative for lower GI symptoms  Constitutional : Negative for symptoms  Skin: Negative for skin symptoms  Eyes: Negative for eye symptoms  Ear/Nose/Throat : Negative for Ear/Nose/Throat symptoms  Hematologic/Lymphatic: Negative for Hematologic/Lymphatic symptoms  Cardiovascular : Negative for cardiovascular symptoms  Respiratory : Cough  Endocrine: Negative for endocrine symptoms  Musculoskeletal: Negative for musculoskeletal symptoms  Neurological: Negative for neurological symptoms  Psychologic: Negative for psychiatric symptoms  

## 2020-08-08 LAB — URINALYSIS, ROUTINE W REFLEX MICROSCOPIC
Bilirubin, UA: NEGATIVE
Glucose, UA: NEGATIVE
Ketones, UA: NEGATIVE
Leukocytes,UA: NEGATIVE
Nitrite, UA: NEGATIVE
Protein,UA: NEGATIVE
Specific Gravity, UA: >1.03 — ABNORMAL HIGH (ref 1.005–1.030)
Urobilinogen, Ur: 0.2 mg/dL (ref 0.2–1.0)
pH, UA: 5.5 (ref 5.0–7.5)

## 2020-08-08 LAB — MICROSCOPIC EXAMINATION
Bacteria, UA: NONE SEEN
Epithelial Cells (non renal): NONE SEEN /hpf (ref 0–10)
Renal Epithel, UA: NONE SEEN /hpf
WBC, UA: NONE SEEN /hpf (ref 0–5)

## 2020-08-08 LAB — PSA: Prostate Specific Ag, Serum: 1.5 ng/mL (ref 0.0–4.0)

## 2020-08-12 ENCOUNTER — Other Ambulatory Visit: Payer: Self-pay | Admitting: Cardiovascular Disease

## 2020-08-14 ENCOUNTER — Ambulatory Visit (INDEPENDENT_AMBULATORY_CARE_PROVIDER_SITE_OTHER): Payer: PPO | Admitting: Ophthalmology

## 2020-08-14 ENCOUNTER — Encounter (INDEPENDENT_AMBULATORY_CARE_PROVIDER_SITE_OTHER): Payer: Self-pay | Admitting: Ophthalmology

## 2020-08-14 ENCOUNTER — Telehealth: Payer: Self-pay

## 2020-08-14 ENCOUNTER — Other Ambulatory Visit: Payer: Self-pay

## 2020-08-14 DIAGNOSIS — E119 Type 2 diabetes mellitus without complications: Secondary | ICD-10-CM | POA: Diagnosis not present

## 2020-08-14 DIAGNOSIS — H4042X0 Glaucoma secondary to eye inflammation, left eye, stage unspecified: Secondary | ICD-10-CM | POA: Diagnosis not present

## 2020-08-14 DIAGNOSIS — H402232 Chronic angle-closure glaucoma, bilateral, moderate stage: Secondary | ICD-10-CM

## 2020-08-14 DIAGNOSIS — H43811 Vitreous degeneration, right eye: Secondary | ICD-10-CM | POA: Diagnosis not present

## 2020-08-14 DIAGNOSIS — H209 Unspecified iridocyclitis: Secondary | ICD-10-CM | POA: Diagnosis not present

## 2020-08-14 DIAGNOSIS — Z9889 Other specified postprocedural states: Secondary | ICD-10-CM | POA: Diagnosis not present

## 2020-08-14 DIAGNOSIS — T85398A Other mechanical complication of other ocular prosthetic devices, implants and grafts, initial encounter: Secondary | ICD-10-CM

## 2020-08-14 NOTE — Progress Notes (Signed)
08/14/2020     CHIEF COMPLAINT Patient presents for Retina Follow Up (9 Mo F/U OU//Pt denies noticeable changes to [place] OU since last visit. Pt denies ocular pain, flashes of light, or floaters OU. //)   HISTORY OF PRESENT ILLNESS: Jordan Goodwin is a 70 y.o. male who presents to the clinic today for:   HPI    Retina Follow Up    Patient presents with  PVD.  In right eye.  This started 9 months ago.  Severity is mild.  Duration of 9 months.  Since onset it is stable. Additional comments: 9 Mo F/U OU  Pt denies noticeable changes to [place] OU since last visit. Pt denies ocular pain, flashes of light, or floaters OU.          Last edited by Ileana Roup, COA on 08/14/2020 10:15 AM. (History)      Referring physician: Assunta Found, MD [address] [place],  [place] [address]  HISTORICAL INFORMATION:   Selected notes from the MEDICAL RECORD NUMBER    Lab Results  Component Value Date   HGBA1C 6.7 (H) 10/11/2013     CURRENT MEDICATIONS: Current Outpatient Medications (Ophthalmic Drugs)  Medication Sig  . brimonidine-timolol (COMBIGAN) 0.2-0.5 % ophthalmic solution INSTILL 1 DROP INTO BOTH EYES TWICE A DAY  . cyclopentolate (CYCLODRYL,CYCLOGYL) 1 % ophthalmic solution Place 1 drop into the left eye at bedtime.   No current facility-administered medications for this visit. (Ophthalmic Drugs)   Current Outpatient Medications (Other)  Medication Sig  . irbesartan-hydrochlorothiazide (AVALIDE) 300-12.5 MG tablet TAKE 1 TABLET BY MOUTH EVERY DAY  . aspirin 81 MG EC tablet Take 81 mg by mouth daily. Swallow whole.  Marland Kitchen atorvastatin (LIPITOR) 40 MG tablet TAKE 1 TABLET BY MOUTH EVERY DAY  . glucosamine-chondroitin 500-400 MG tablet Take 1 tablet by mouth 2 (two) times daily.  Marland Kitchen levothyroxine (SYNTHROID) 200 MCG tablet Take 200 mcg by mouth daily.  . metFORMIN (GLUCOPHAGE) 500 MG tablet Take 1 tablet by mouth daily.  . metoprolol succinate (TOPROL-XL) 50 MG 24 hr tablet TAKE  1 TABLET BY MOUTH EVERY DAY  . triamcinolone (KENALOG) 0.1 % SMARTSIG:1 Application Topical 2-3 Times Daily   No current facility-administered medications for this visit. (Other)      REVIEW OF SYSTEMS:    ALLERGIES Allergies  Allergen Reactions  . Warfarin And Related     Compartment syndrome    PAST MEDICAL HISTORY Past Medical History:  Diagnosis Date  . Complication of anesthesia    As a child, woke up "yelling."  . Diabetes mellitus   . DVT (deep venous thrombosis) (HCC) 08/2009   left lower extremity and had a DVT inthe left posterior tibial vein  . Hypertension   . Mixed hyperlipidemia    low HDL  . Murmur   . SOB (shortness of breath) 08/01/2010   2D Echo EF >55%   Past Surgical History:  Procedure Laterality Date  . CATARACT EXTRACTION, BILATERAL    . COLONOSCOPY N/A 03/19/2015   Procedure: COLONOSCOPY;  Surgeon: Corbin Ade, MD;  Location: [facility];  Service: Endoscopy;  Laterality: N/A;  9:30 AM  . KNEE ARTHROSCOPY  2012   left knee/torn meniscus  . LEG SURGERY  2012   LLE/compartment syndrome after blood clot  . RETINAL DETACHMENT SURGERY     x's 2  . TONSILLECTOMY  1962    FAMILY HISTORY Family History  Problem Relation Age of Onset  . Cancer Father 69  deceased  . Heart disease Maternal Grandmother   . Heart disease Paternal Grandfather     SOCIAL HISTORY Social History   Tobacco Use  . Smoking status: Never Smoker  . Smokeless tobacco: Never Used  Substance Use Topics  . Alcohol use: Yes    Comment: occasionally drinks beer,whiskey  . Drug use: No         OPHTHALMIC EXAM:  Base Eye Exam    Visual Acuity (ETDRS)      Right Left   Dist cc 20/25 +2 20/20 -2   Correction: Glasses       Tonometry (Tonopen, 10:15 AM)      Right Left   Pressure 15 10       Pupils      Dark Light Shape React APD   Right 3.5 2.5 Round Brisk None   Left 7 7 Round Minimal None       Visual Fields (Counting fingers)      Left  Right    Full Full       Extraocular Movement      Right Left    Full Full       Neuro/Psych    Oriented x3: Yes   Mood/Affect: Normal       Dilation    Both eyes: 1.0% Mydriacyl, 2.5% Phenylephrine @ 10:19 AM        Slit Lamp and Fundus Exam    External Exam      Right Left   External Normal Normal       Slit Lamp Exam      Right Left   Lids/Lashes Normal Normal   Conjunctiva/Sclera White and quiet White and quiet   Cornea Clear Clear   Anterior Chamber Deep and quiet Deep and quiet   Iris Round and reactive PI at 6, patent   Lens Centered posterior chamber intraocular lens Centered posterior chamber intraocular lens   Anterior Vitreous Normal Normal       Fundus Exam      Right Left   Posterior Vitreous Clear vitrectomized Clear vitrectomized   Disc Normal Normal   C/D Ratio 0.5 0.45   Macula Normal Normal   Vessels Normal, no DR Normal, no DR   Periphery Good scleral buckle, retinal cryopexy a attached Normal          IMAGING AND PROCEDURES  Imaging and Procedures for 08/14/20  OCT, Retina - OU - Both Eyes       Right Eye Quality was good. Scan locations included subfoveal. Central Foveal Thickness: 305. Progression has been stable. Findings include normal foveal contour.   Left Eye Quality was good. Scan locations included subfoveal. Central Foveal Thickness: 301. Progression has been stable.                 ASSESSMENT/PLAN:  Diabetes type 2, controlled (HCC) No detectable diabetic retinopathy  Uveitis-hyphema-glaucoma syndrome of left eye Patient also continues on Cyclogyl nightly left eye simply to immobilize the pupil and to diminish the risk of chafe recurrence  History of vitrectomy Observe  Primary angle-closure glaucoma, moderate stage Follow-up with Dr. Daisy Lazar as scheduled and continue topical medication for glaucoma      ICD-10-CM   1. Posterior vitreous detachment of right eye  H43.811 OCT, Retina - OU - Both  Eyes  2. Uveitis-hyphema-glaucoma syndrome of left eye  T85.398A    H20.9    H40.42X0   3. Controlled type 2 diabetes mellitus without complication, without long-term current  use of insulin (HCC)  E11.9   4. History of vitrectomy  Z98.890   5. 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  Patient continues to do very well after initial vitrectomy for recurrent vitreous hemorrhages and finally after laser iridotomy to diminish syndrome from iris capsular bag abrasive injury which should multiple recurrences up until the year 2020 when this procedure was performed, laser iridotomy inferiorly OS  2.  3.  Ophthalmic Meds Ordered this visit:  No orders of the defined types were placed in this encounter.      No follow-ups on file.  There are no Patient Instructions on file for this visit.   Explained the diagnoses, plan, and follow up with the patient and they expressed understanding.  Patient expressed understanding of the importance of proper follow up care.   Alford Highland Rankin M.D. Diseases & Surgery of the Retina and Vitreous [facility] 08/14/20     Abbreviations: M myopia (nearsighted); A astigmatism; H hyperopia (farsighted); P presbyopia; Mrx spectacle prescription;  CTL contact lenses; OD right eye; OS left eye; OU both eyes  XT exotropia; ET esotropia; PEK punctate epithelial keratitis; PEE punctate epithelial erosions; DES dry eye syndrome; MGD meibomian gland dysfunction; ATs artificial tears; PFAT's preservative free artificial tears; NSC nuclear sclerotic cataract; PSC posterior subcapsular cataract; ERM epi-retinal membrane; PVD posterior vitreous detachment; RD retinal detachment; DM diabetes mellitus; DR diabetic retinopathy; NPDR non-proliferative diabetic retinopathy; PDR proliferative diabetic retinopathy; CSME clinically significant macular edema; DME diabetic macular edema; dbh dot blot hemorrhages; CWS cotton  wool spot; POAG primary open angle glaucoma; C/D cup-to-disc ratio; HVF humphrey visual field; GVF goldmann visual field; OCT optical coherence tomography; IOP intraocular pressure; BRVO Branch retinal vein occlusion; CRVO central retinal vein occlusion; CRAO central retinal artery occlusion; BRAO branch retinal artery occlusion; RT retinal tear; SB scleral buckle; PPV pars plana vitrectomy; VH Vitreous hemorrhage; PRP panretinal laser photocoagulation; IVK intravitreal kenalog; VMT vitreomacular traction; MH Macular hole;  NVD neovascularization of the disc; NVE neovascularization elsewhere; AREDS age related eye disease study; ARMD age related macular degeneration; POAG primary open angle glaucoma; EBMD epithelial/anterior basement membrane dystrophy; ACIOL anterior chamber intraocular lens; IOL intraocular lens; PCIOL posterior chamber intraocular lens; Phaco/IOL phacoemulsification with intraocular lens placement; PRK photorefractive keratectomy; LASIK laser assisted in situ keratomileusis; HTN hypertension; DM diabetes mellitus; COPD chronic obstructive pulmonary disease

## 2020-08-14 NOTE — Assessment & Plan Note (Signed)
Patient also continues on Cyclogyl nightly left eye simply to immobilize the pupil and to diminish the risk of chafe recurrence

## 2020-08-14 NOTE — Progress Notes (Signed)
Letter sent via mail 

## 2020-08-14 NOTE — Telephone Encounter (Signed)
Pt called and notified

## 2020-08-14 NOTE — Telephone Encounter (Signed)
-----   Message from Marcine Matar, MD sent at 08/14/2020  9:16 AM EDT ----- Notify patient PSA normal, 1.5 ----- Message ----- From: Dalia Heading, LPN Sent: 07/05/9530   8:49 AM EDT To: Marcine Matar, MD  Pls review.

## 2020-08-14 NOTE — Progress Notes (Signed)
Pt called and notified

## 2020-08-14 NOTE — Assessment & Plan Note (Signed)
No detectable diabetic retinopathy 

## 2020-08-14 NOTE — Assessment & Plan Note (Signed)
Observe

## 2020-08-14 NOTE — Assessment & Plan Note (Signed)
Follow-up with Dr. Daisy Lazar as scheduled and continue topical medication for glaucoma

## 2020-08-29 DIAGNOSIS — E119 Type 2 diabetes mellitus without complications: Secondary | ICD-10-CM | POA: Diagnosis not present

## 2020-08-29 DIAGNOSIS — E7849 Other hyperlipidemia: Secondary | ICD-10-CM | POA: Diagnosis not present

## 2020-08-29 DIAGNOSIS — I10 Essential (primary) hypertension: Secondary | ICD-10-CM | POA: Diagnosis not present

## 2020-09-04 IMAGING — CT CT RENAL STONE PROTOCOL
2 of 4 series · 15 of 46 positions shown, 17 images · non-contrast
Comparison: None.

CLINICAL DATA: 67-year-old male with history of left-sided flank
pain and hematuria since 03/14/2018.

EXAM:
CT ABDOMEN AND PELVIS WITHOUT CONTRAST
TECHNIQUE: Multidetector CT imaging of the abdomen and pelvis was performed
following the standard protocol without IV contrast.

[Series 2: axial st · axial · 0.86mm/px · z∈[-734,-314]mm · 12 of 94 slices shown, 14 images]
[im 5/94  soft-tissue]
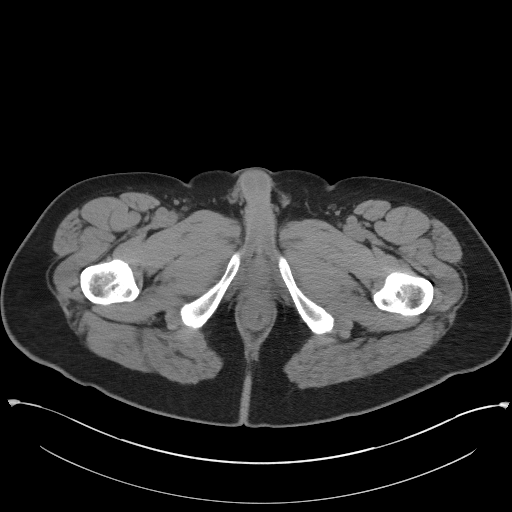
[im 5/94  bone]
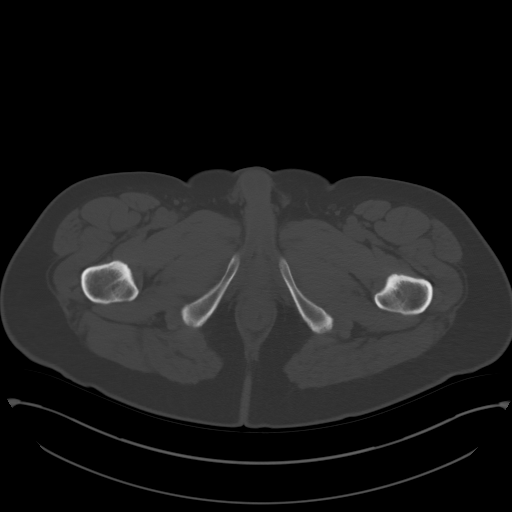
[im 13/94  soft-tissue]
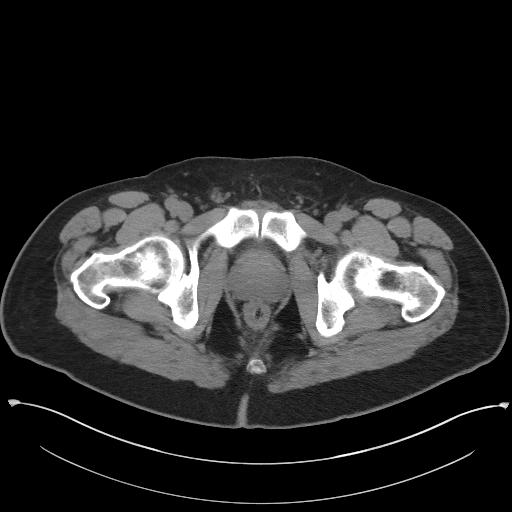
[im 22/94  soft-tissue]
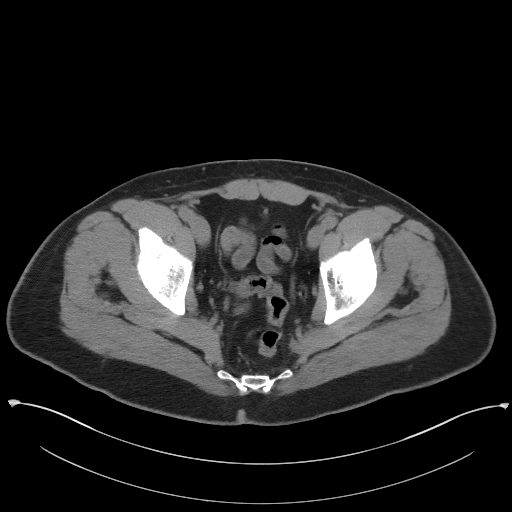
[im 30/94  soft-tissue]
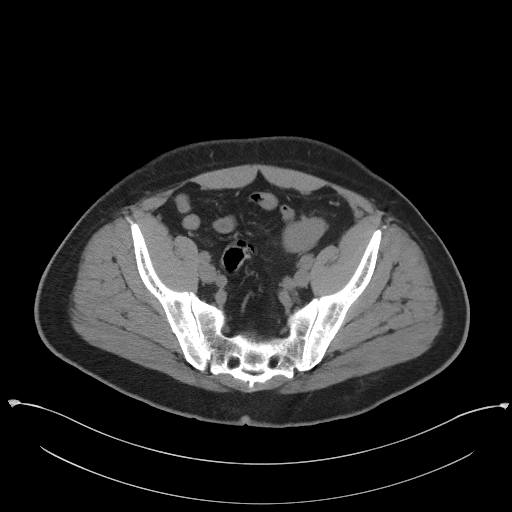
[im 34/94  soft-tissue]
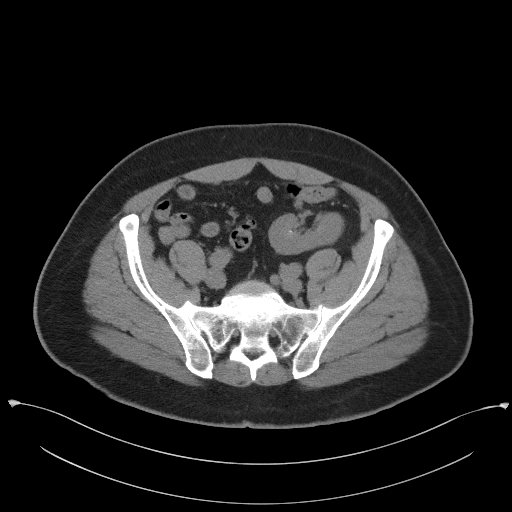
[im 43/94  soft-tissue]
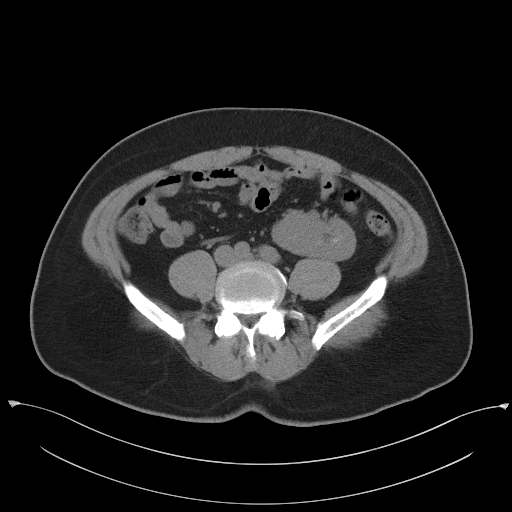
[im 51/94  soft-tissue]
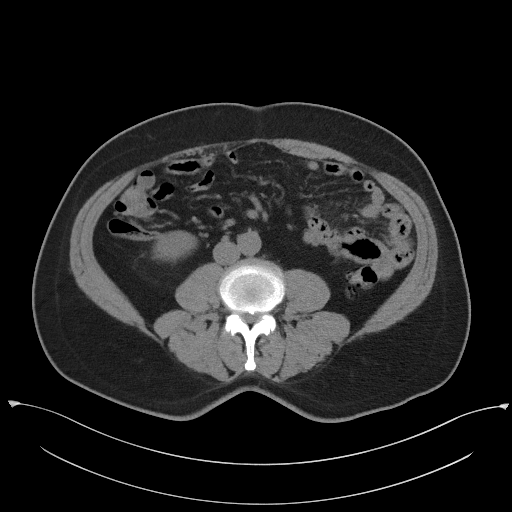
[im 60/94  soft-tissue]
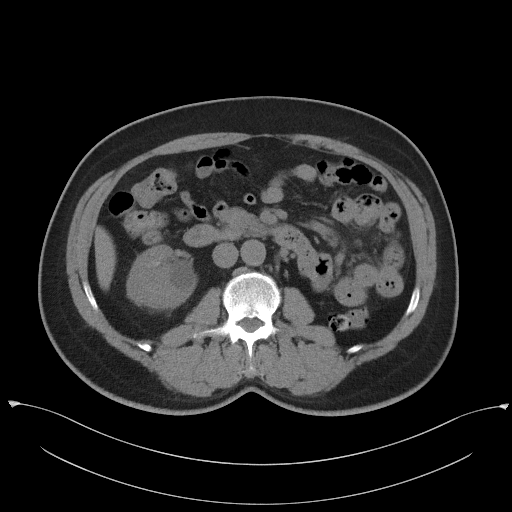
[im 64/94  soft-tissue]
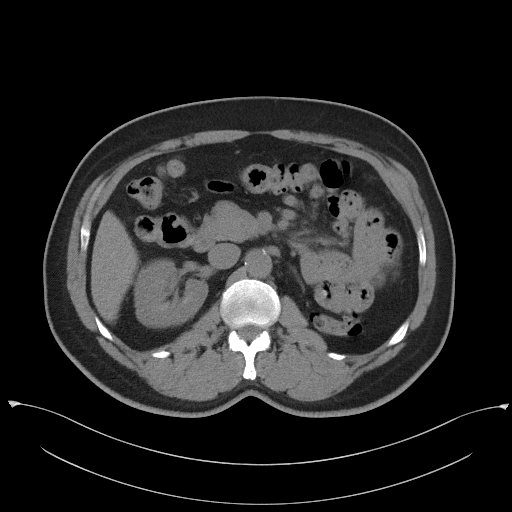
[im 64/94  bone]
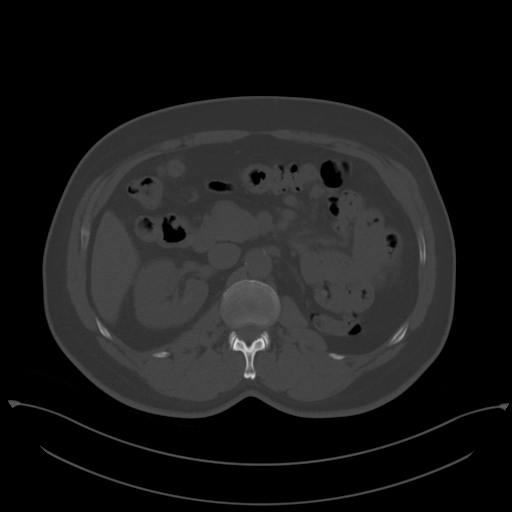
[im 72/94  soft-tissue]
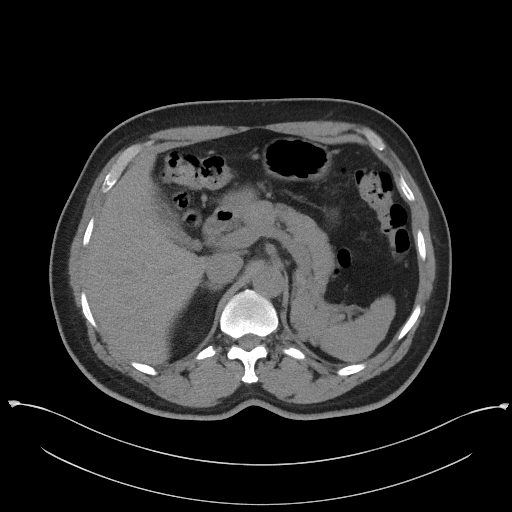
[im 81/94  soft-tissue]
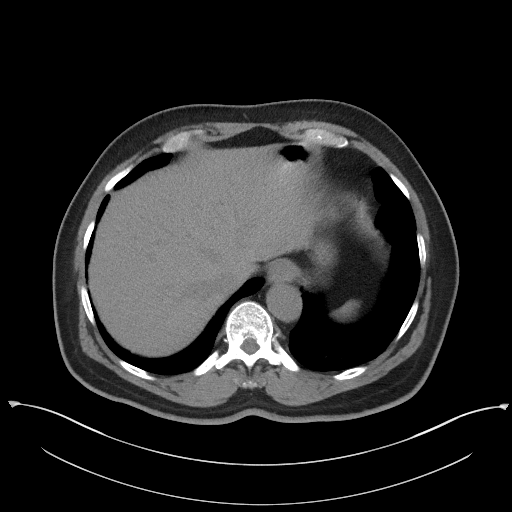
[im 89/94  soft-tissue]
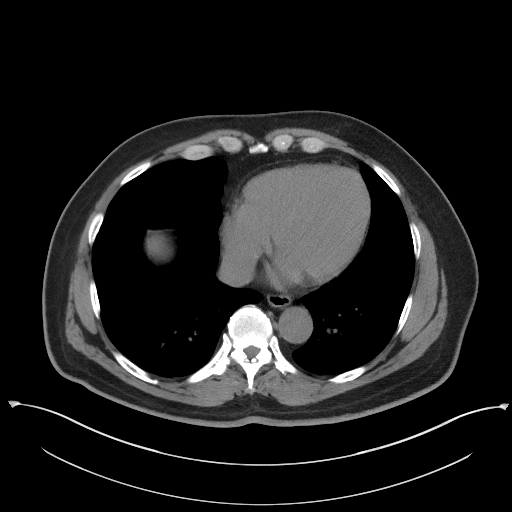

[Series 5: coronal st · coronal · 0.78mm/px · 3 of 96 slices shown]
[im 32/96  soft-tissue]
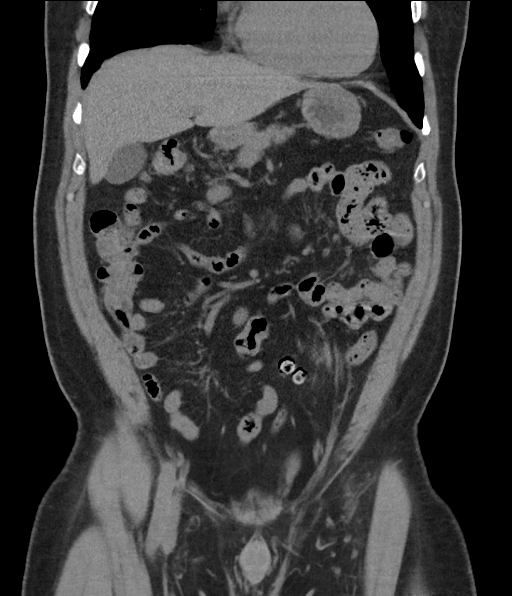
[im 43/96  soft-tissue]
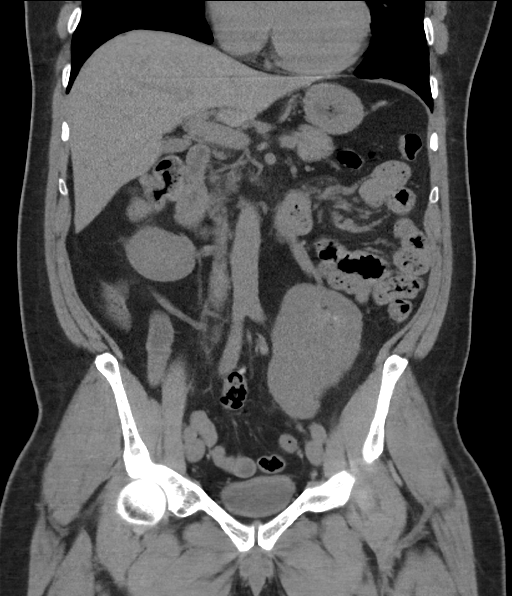
[im 53/96  soft-tissue]
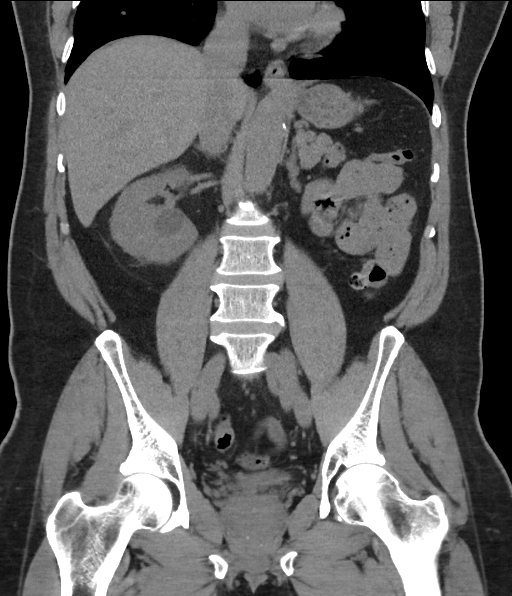

[15 of 46 positions shown; findings below may reference images not displayed]

FINDINGS: Lower chest: Emphysematous changes in the lung bases bilaterally.
Aortic atherosclerosis.

Hepatobiliary: 11 mm low-attenuation lesion in segment 4A of the
liver, incompletely characterized on today's noncontrast CT
examination, but statistically likely to represent a tiny cyst. No
larger more suspicious appearing hepatic lesions. Unenhanced
appearance of the gallbladder is normal.

Pancreas: No definite pancreatic mass or peripancreatic fluid or
inflammatory changes are noted on today's noncontrast CT
examination.

Spleen: Unremarkable.

Adrenals/Urinary Tract: 2.7 cm low-attenuation lesion in the lower
pole of the right kidney, incompletely characterized on today's
noncontrast CT examination, but statistically likely a cyst.
Left-sided pelvic kidney. Within both kidneys there is some
amorphous high attenuation associated with the medullary pyramid 3S,
in addition to several tiny 1-2 mm nonobstructive calculi
bilaterally. No calcifications are noted along the course of either
ureter or within the lumen of the urinary bladder. No
hydroureteronephrosis. Unenhanced appearance of the urinary bladder
is normal. Bilateral adrenal glands are normal in appearance.

Stomach/Bowel: Unenhanced appearance of the stomach is normal. No
pathologic dilatation of small bowel or colon. Numerous colonic
diverticulae are noted, particularly in the sigmoid colon, without
surrounding inflammatory changes to suggest an acute diverticulitis
at this time. Normal appendix.

Vascular/Lymphatic: Aortic atherosclerosis. No lymphadenopathy noted
in the abdomen or pelvis.

Reproductive: Prostate gland and seminal vesicles are unremarkable
in appearance.

Other: No significant volume of ascites.  No pneumoperitoneum.

Musculoskeletal: There are no aggressive appearing lytic or blastic
lesions noted in the visualized portions of the skeleton.
IMPRESSION: 1. The appearance of the kidneys suggests medullary
nephrocalcinosis, as above. This includes the presence of several
tiny 1-2 mm nonobstructive calculi in both kidneys. No ureteral
stones or findings of urinary tract obstruction are noted at this
time.
2. 2.7 cm low-attenuation lesion in the lower pole of the right
kidney, incompletely characterized on today's noncontrast CT
examination, but statistically likely a cyst.
3. Colonic diverticulosis without evidence of acute diverticulitis
at this time.
4. Aortic atherosclerosis.
5. Emphysema.

Aortic Atherosclerosis (01CO5-FAR.R) and Emphysema (01CO5-7N2.8).

## 2020-10-30 DIAGNOSIS — I10 Essential (primary) hypertension: Secondary | ICD-10-CM | POA: Diagnosis not present

## 2020-10-30 DIAGNOSIS — E7849 Other hyperlipidemia: Secondary | ICD-10-CM | POA: Diagnosis not present

## 2020-10-30 DIAGNOSIS — E119 Type 2 diabetes mellitus without complications: Secondary | ICD-10-CM | POA: Diagnosis not present

## 2020-10-31 DIAGNOSIS — E7849 Other hyperlipidemia: Secondary | ICD-10-CM | POA: Diagnosis not present

## 2020-10-31 DIAGNOSIS — Z0001 Encounter for general adult medical examination with abnormal findings: Secondary | ICD-10-CM | POA: Diagnosis not present

## 2020-10-31 DIAGNOSIS — Z1331 Encounter for screening for depression: Secondary | ICD-10-CM | POA: Diagnosis not present

## 2020-10-31 DIAGNOSIS — E119 Type 2 diabetes mellitus without complications: Secondary | ICD-10-CM | POA: Diagnosis not present

## 2020-10-31 DIAGNOSIS — Z6824 Body mass index (BMI) 24.0-24.9, adult: Secondary | ICD-10-CM | POA: Diagnosis not present

## 2020-10-31 DIAGNOSIS — I10 Essential (primary) hypertension: Secondary | ICD-10-CM | POA: Diagnosis not present

## 2020-11-05 DIAGNOSIS — Z6824 Body mass index (BMI) 24.0-24.9, adult: Secondary | ICD-10-CM | POA: Diagnosis not present

## 2020-11-05 DIAGNOSIS — E063 Autoimmune thyroiditis: Secondary | ICD-10-CM | POA: Diagnosis not present

## 2020-11-05 DIAGNOSIS — Z1389 Encounter for screening for other disorder: Secondary | ICD-10-CM | POA: Diagnosis not present

## 2020-11-05 DIAGNOSIS — Z0000 Encounter for general adult medical examination without abnormal findings: Secondary | ICD-10-CM | POA: Diagnosis not present

## 2020-11-06 DIAGNOSIS — E7849 Other hyperlipidemia: Secondary | ICD-10-CM | POA: Diagnosis not present

## 2020-11-29 DIAGNOSIS — I10 Essential (primary) hypertension: Secondary | ICD-10-CM | POA: Diagnosis not present

## 2020-11-29 DIAGNOSIS — E119 Type 2 diabetes mellitus without complications: Secondary | ICD-10-CM | POA: Diagnosis not present

## 2020-11-29 DIAGNOSIS — E782 Mixed hyperlipidemia: Secondary | ICD-10-CM | POA: Diagnosis not present

## 2020-12-24 DIAGNOSIS — M25512 Pain in left shoulder: Secondary | ICD-10-CM | POA: Diagnosis not present

## 2020-12-24 DIAGNOSIS — M542 Cervicalgia: Secondary | ICD-10-CM | POA: Diagnosis not present

## 2021-01-23 DIAGNOSIS — M5412 Radiculopathy, cervical region: Secondary | ICD-10-CM | POA: Diagnosis not present

## 2021-01-31 ENCOUNTER — Encounter: Payer: Self-pay | Admitting: Cardiovascular Disease

## 2021-01-31 ENCOUNTER — Other Ambulatory Visit: Payer: Self-pay

## 2021-01-31 ENCOUNTER — Ambulatory Visit: Payer: PPO | Admitting: Cardiovascular Disease

## 2021-01-31 VITALS — BP 140/70 | HR 58 | Ht 75.0 in | Wt 208.6 lb

## 2021-01-31 DIAGNOSIS — E785 Hyperlipidemia, unspecified: Secondary | ICD-10-CM | POA: Diagnosis not present

## 2021-01-31 DIAGNOSIS — I440 Atrioventricular block, first degree: Secondary | ICD-10-CM | POA: Diagnosis not present

## 2021-01-31 DIAGNOSIS — I10 Essential (primary) hypertension: Secondary | ICD-10-CM | POA: Diagnosis not present

## 2021-01-31 DIAGNOSIS — Z86718 Personal history of other venous thrombosis and embolism: Secondary | ICD-10-CM | POA: Diagnosis not present

## 2021-01-31 MED ORDER — AMLODIPINE BESYLATE 2.5 MG PO TABS: 2.5000 mg | ORAL_TABLET | Freq: Every day | ORAL | 3 refills | Status: DC

## 2021-01-31 NOTE — Patient Instructions (Signed)
Medication Instructions:  Start Amlodipine 2.5 mg daily   *If you need a refill on your cardiac medications before your next appointment, please call your pharmacy*  Follow-Up: At [facility], you and your health needs are our priority.  As part of our continuing mission to provide you with exceptional heart care, we have created designated Provider Care Teams.  These Care Teams include your primary Cardiologist (physician) and Advanced Practice Providers (APPs -  Physician Assistants and Nurse Practitioners) who all work together to provide you with the care you need, when you need it.  We recommend signing up for the patient portal called "MyChart".  Sign up information is provided on this After Visit Summary.  MyChart is used to connect with patients for Virtual Visits (Telemedicine).  Patients are able to view lab/test results, encounter notes, upcoming appointments, etc.  Non-urgent messages can be sent to your provider as well.   To learn more about what you can do with MyChart, go to [email or web address].    Your next appointment:   6 month(s)  The format for your next appointment:   In Person  Provider:   Nicki Guadalajara, MD   Other Instructions See primary care provider in 3 months.

## 2021-01-31 NOTE — Progress Notes (Signed)
Patient ID: Jordan Goodwin, male   DOB: April 11, 1951, 70 y.o.   MRN: [id number]                                                                                                                                                 HPI: Jordan Goodwin is a 70 y.o. male who presents to the office for a 12 month followup evaluation.  Jordan Goodwin has a history of left lower extremity DVT in April 2011 involving the left posterior tibial vein. He was treated with Lovenox and Coumadin and subsequently developed a calf hematoma leading to compartment syndrome requiring fasciotomy on 09/18/2009 and had a spontaneous pulmonary hemorrhage 09/22/2009. 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 He has history of diabetes mellitus and remotely he has had elevated PSAs felt  to be due to infection rather than cancer. Over the past year, he was taken off his Benicar 20 mg and this was substituted by his insurance company  with losartan 50 mg  titrated to 100 mg daily. Subsequently, this still did not control his blood pressure and he was switched to research and hydrochlorothiazide 300/12.5 mg.    An echo Doppler study in March 2012 with suggested mild to moderate aortic insufficiency.  Normal systolic function.  There was mild mitral regurgitation, mild tricuspid regurgitation.  Pulmonary pressure was normal.    He has a history of hypothyroidism and recently has been on Synthroid at 50 mcg.  He also has a history of hyperlipidemia, in addition to type 2 diabetes mellitus.  Laboratory last year revealed a total cholesterol of 134, HDL 43, triglycerides 48, LDL 81.  However, LDL particle number was increased at 1299.     When I saw him in April 2019  he was doing well.  He was exercising at least 3 days/week and working as an Chief Financial Officer at [employer].  He denied any chest pain, PND orthopnea or issues with leg swelling.  He is  followed by Dr. Hilma Favors.  He has a history of diabetes mellitus for approximately 10 years.  Laboratory from November 2018 showed hemoglobin A1c elevated at 7.6.  He has a history of hyperlipidemia.  His LDL cholesterol in October 2018 was still elevated at 92.  He has hypothyroidism and is on levothyroxine 175 mcg.   I saw him in July 2020 at which time he continued to do well.  He specifically denied any  chest pain or shortness of breath.  He was walking daily at least a mile.  He denies any exertional dyspnea.  He denies any significant leg swelling.  He was on irbesartan HCT 300/12.5 mg daily and Toprol-XL 50 mg daily.  He was diabetic on metformin, and continue to be on atorvastatin 40 mg for hyperlipidemia  and levothyroxine for hypothyroidism.   I last saw him in August 2021 and since his prior evaluation he continued to do well.   He is now retired and retired shortly before turning 61.  He has not been able to travel or go to the gym due to the COVID-19 pandemic but remains active.  He was on irbesartan HCT 300/12.5 mg in addition to Toprol-XL 50 mg.  Blood pressure at home has been stable.  He was unaware of palpitations or chest pain.  He continues to be on levothyroxine 200 mcg for hypothyroidism, Metformin now at 500 mg for diabetes and atorvastatin 40 mg.  Laboratory by Dr. Sharilyn Sites on Oct 10, 2019 showed an LDL cholesterol at 69.  Hemoglobin A1c was 6.8.  TSH in June 2020 was 1.73.  He is followed by Dr. Franchot Gallo for urologic health.  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    Since I last saw him, he has felt well.  He denies any chest pain or shortness of breath.  Recently, he has noticed his blood pressure to be mildly elevated.  He has continued to be on irbesartan HCT 300/12.5 mg and metoprolol succinate 50 mg daily.  He is on levothyroxine at 200 mcg for hypothyroidism and atorvastatin 40 mg  for hyperlipidemia.  He is diabetic on metformin.  Laboratory from November 05, 2020 was reviewed which showed an LDL cholesterol of 66.  Hemoglobin A1c was 6.8.  TSH was 3.36.  He presents for yearly evaluation.   Past Medical History  Diagnosis Date   Hypertension    Diabetes mellitus    Mixed hyperlipidemia     low HDL   SOB (shortness of breath) 08/01/2010    2D Echo EF >55%   DVT (deep venous thrombosis) 08/2009    left lower extremity and had a DVT inthe left posterior tibial vein   Murmur     Past Surgical History  Procedure Laterality Date   Tonsillectomy  1962   Retinal detachment surgery      x's 2   Cataract extraction, bilateral     Leg surgery  2012    LLE/compartment syndrome after blood clot   Knee arthroscopy  2012    left knee/torn meniscus    Allergies  Allergen Reactions   Warfarin And Related     Compartment syndrome    Current Outpatient Prescriptions  Medication Sig Dispense Refill   aspirin 81 MG EC tablet Take 81 mg by mouth daily. Swallow whole.       atorvastatin (LIPITOR) 20 MG tablet Take 20 mg by mouth daily.       glucosamine-chondroitin 500-400 MG tablet Take 1 tablet by mouth 2 (two) times daily.       irbesartan (AVAPRO) 300 MG tablet Take 1 tablet (300 mg total) by mouth at bedtime.  30 tablet  1   levothyroxine (SYNTHROID, LEVOTHROID) 50 MCG tablet Take 50 mcg by mouth daily before breakfast.       metoprolol succinate (TOPROL-XL) 50 MG 24 hr tablet Take 50 mg by mouth daily. Take with or immediately following a meal.       niacin (NIASPAN) 1000 MG CR tablet TAKE 1 TABLET BY MOUTH AT BEDTIME  90 tablet  3   sitaGLIPtin (JANUVIA) 100 MG tablet Take 100 mg by mouth daily.       No current facility-administered medications for this visit.  Socially he is married and has one child age 53. He exercises approximately 3-4 days per week. There is no tobacco history. He works as an Chief Financial Officer for [employer].   ROS General: Positive for fatigue;  No fevers, chills, or night sweats;  HEENT: Negative; No changes in vision or hearing, sinus congestion, difficulty swallowing Pulmonary: Negative; No cough, wheezing, shortness of breath, hemoptysis Cardiovascular: Negative; No chest pain, presyncope, syncope, palpatations GI: Negative; No nausea, vomiting, diarrhea, or abdominal pain GU: Negative; No dysuria, hematuria, or difficulty voiding Musculoskeletal: Negative; no myalgias, joint pain, or weakness Hematologic/Oncology: Negative; no easy bruising, bleeding Endocrine: Positive for diabetes , and hypothyroidism; no heat/cold intolerance;  Neuro: Negative; no changes in balance, headaches Skin: Negative; No rashes or skin lesions Psychiatric: Negative; No behavioral problems, depression Sleep: Negative; No snoring, daytime sleepiness, hypersomnolence, bruxism, restless legs, hypnogognic hallucinations, no cataplexy Other comprehensive 14 point system review is negative.   PE BP 140/70 (BP Location: Right Arm)   Pulse (!) 58   Ht 6' 3" (1.905 m)   Wt 208 lb 9.6 oz (94.6 kg)   SpO2 98%   BMI 26.07 kg/m    Repeat blood pressure by me was 155-160/75.  Wt Readings from Last 3 Encounters:  01/31/21 208 lb 9.6 oz (94.6 kg)  08/07/20 200 lb (90.7 kg)  01/23/20 199 lb 6.4 oz (90.4 kg)   General: Alert, oriented, no distress.  Skin: normal turgor, no rashes, warm and dry HEENT: Normocephalic, atraumatic. Pupils equal round and reactive to light; sclera anicteric; extraocular muscles intact; Nose without nasal septal hypertrophy Mouth/Parynx benign; Mallinpatti scale 3 Neck: No JVD, no carotid bruits; normal carotid upstroke Lungs: clear to ausculatation and percussion; no wheezing or rales Chest wall: without tenderness to palpitation Heart: PMI not displaced, RRR, s1 s2 normal, 1/6 systolic murmur, no diastolic murmur, no rubs, gallops, thrills, or heaves Abdomen: soft, nontender; no hepatosplenomehaly, BS+; abdominal aorta  nontender and not dilated by palpation. Back: no CVA tenderness Pulses 2+ Musculoskeletal: full range of motion, normal strength, no joint deformities Extremities: no clubbing cyanosis or edema, Homan's sign negative  Neurologic: grossly nonfocal; Cranial nerves grossly wnl Psychologic: Normal mood and affect   January 31, 2021 ECG (independently read by me): Sinus bradycardia at 58, borderline LVH, first-degree AV block with PR interval at 218 ms  January 23, 2020 ECG (independently read by me): Sinus bradycardia 54 bpm.  Nonspecific T wave abnormality.  QT TC interval 396 ms.  PR interval 202 ms.  July 2020 ECG (independently read by me): Sinus Bradycardia at 59 with first degree AV block PR interval 272m  April 2019 ECG (independently read by me): Normal sinus rhythm at 60 bpm.  Borderline first-degree AV block with a PR segment 206 ms.  March 2018 ECG (independently read by me): On his bradycardia with first-degree AV block with a ventricular rate of 56 bpm.  PR interval 212 ms.  Nonspecific T changes.  November 2016 ECG (independently read by me): Sinus bradycardia 59 bpm.  First degree AV block with a PR interval 208 ms.  Nonspecific T changes.  May 2015 ECG : Sinus rhythm at 59 beats per minute.  Borderline first-degree block with PR interval 206 ms.  No significant ST changes.  Prior ECG: Sinus bradycardia 55 beats per minute.  Nonspecific T changes. Normal intervals.  LABS:  BMP Latest Ref Rng & Units 10/11/2013 05/02/2013 11/09/2012  Glucose 70 - 99 mg/dL 145(H) 133(H) 150(H)  BUN 6 - 23 mg/dL  _0 Creatinine 0.50 - 1.35 mg/dL 0.90 0.99 1.00  Sodium 135 - 145 mEq/L 141 140 141  Potassium 3.5 - 5.3 mEq/L 4.0 4.4 4.1  Chloride 96 - 112 mEq/L 105 105 104  CO2 19 - 32 mEq/L _1 Calcium 8.4 - 10.5 mg/dL 9.1 9.7 9.2   Hepatic Function Latest Ref Rng & Units 10/11/2013 11/14/2009 09/23/2009  Total Protein 6.0 - 8.3 g/dL 6.9 7.0 6.2  Albumin 3.5 - 5.2 g/dL 4.4 4.1  2.7(L)  AST 0 - 37 U/L 20 23 40(H)  ALT 0 - 53 U/L 17 19 66(H)  Alk Phosphatase 39 - 117 U/L 46 52 63  Total Bilirubin 0.2 - 1.2 mg/dL 0.6 0.8 0.5   CBC Latest Ref Rng & Units 10/11/2013 02/11/2010 11/14/2009  WBC 4.0 - 10.5 K/uL 4.5 5.8 4.8  Hemoglobin 13.0 - 17.0 g/dL 13.1 13.5 12.2(L)  Hematocrit 39.0 - 52.0 % 38.9(L) 41.6 37.3(L)  Platelets 150 - 400 K/uL 183 183 206   Lab Results  Component Value Date   MCV 68.7 (L) 10/11/2013   MCV 71.4 (L) 02/11/2010   MCV 73.5 (L) 11/14/2009   Lab Results  Component Value Date   TSH 6.283 (H) 11/14/2013   Lab Results  Component Value Date   HGBA1C 6.7 (H) 10/11/2013   Lipid Panel     Component Value Date/Time   CHOL 134 10/11/2013 1309   TRIG 48 10/11/2013 1309   HDL 43 10/11/2013 1309   LDLCALC 81 10/11/2013 1309    RADIOLOGY: No results found.  IMPRESSION:  1. Essential hypertension   2. Hyperlipidemia with target LDL less than 70   3. History of DVT (deep vein thrombosis)   4. First degree AV block     ASSESSMENT AND PLAN:  Jordan Goodwin is a 44 -year-old gentleman who has a history of DVT and has not had any recurrence.  He  is no longer on chronic anticoagulation, but continues to take aspirin 81 mg daily.   He denies recurrent leg swelling.  He has a history of hypertension and blood pressures in the past have been relatively well controlled on have combination therapy consisting of irbesartan HCT 300/12.5 mg in addition to metoprolol succinate 50 mg daily.  He denies any leg swelling.  He continues to be active.  Recently he has noticed some mild blood pressure elevation at home.  Blood pressure when taken by me was 185-631 systolically with diastolics in the mid 49F.  I discussed with him most recent hypertensive guidelines with target blood pressure less than 130/80 and ideal blood pressure less than 120/80.  I have recommended initiation of low-dose amlodipine 2.5 mg.  He is bradycardic on his current dose of metoprolol  and this will not be further increased.  He continues to have mild first-degree AV block with a PR interval of 218 ms.  He is on levothyroxine at 200 mcg for hypothyroidism.  TSH was normal at 3.36.  He is doing well with atorvastatin 40 mg and most recent LDL cholesterol was 66.  He is diabetic on metformin and most recent hemoglobin A1c was 6.8.  I have recommended he follow-up at [facility] in 3 months and see me in 6 months for follow-up evaluation.    Shelva Majestic, MD  02/02/2021  1:42 PM

## 2021-02-02 ENCOUNTER — Encounter: Payer: Self-pay | Admitting: Cardiovascular Disease

## 2021-02-06 DIAGNOSIS — M542 Cervicalgia: Secondary | ICD-10-CM | POA: Diagnosis not present

## 2021-03-01 ENCOUNTER — Other Ambulatory Visit: Payer: Self-pay | Admitting: Cardiovascular Disease

## 2021-03-01 DIAGNOSIS — E119 Type 2 diabetes mellitus without complications: Secondary | ICD-10-CM | POA: Diagnosis not present

## 2021-03-01 DIAGNOSIS — I10 Essential (primary) hypertension: Secondary | ICD-10-CM | POA: Diagnosis not present

## 2021-03-01 DIAGNOSIS — E782 Mixed hyperlipidemia: Secondary | ICD-10-CM | POA: Diagnosis not present

## 2021-05-01 DIAGNOSIS — E119 Type 2 diabetes mellitus without complications: Secondary | ICD-10-CM | POA: Diagnosis not present

## 2021-05-01 DIAGNOSIS — I10 Essential (primary) hypertension: Secondary | ICD-10-CM | POA: Diagnosis not present

## 2021-05-01 DIAGNOSIS — E782 Mixed hyperlipidemia: Secondary | ICD-10-CM | POA: Diagnosis not present

## 2021-06-19 DIAGNOSIS — E663 Overweight: Secondary | ICD-10-CM | POA: Diagnosis not present

## 2021-06-19 DIAGNOSIS — Z6826 Body mass index (BMI) 26.0-26.9, adult: Secondary | ICD-10-CM | POA: Diagnosis not present

## 2021-06-19 DIAGNOSIS — R059 Cough, unspecified: Secondary | ICD-10-CM | POA: Diagnosis not present

## 2021-06-19 DIAGNOSIS — J029 Acute pharyngitis, unspecified: Secondary | ICD-10-CM | POA: Diagnosis not present

## 2021-07-25 ENCOUNTER — Other Ambulatory Visit: Payer: Self-pay | Admitting: Cardiovascular Disease

## 2021-07-25 ENCOUNTER — Other Ambulatory Visit (INDEPENDENT_AMBULATORY_CARE_PROVIDER_SITE_OTHER): Payer: Self-pay | Admitting: Ophthalmology

## 2021-08-11 NOTE — Progress Notes (Incomplete)
History of Present Illness:   He initially presented in 2011 with a PSA of 5.25. This decreased to below 3 shortly after his first visit. It was felt that he had a UTI. His PSA has been normal since that time.        Simple Renal Cyst:      Right renal cyst seen on recent CT scan. CT scan was performed for left-sided back pain with hematuria.   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 No ureteral stones. Pain was not crampy in nature. Not associated with nausea and vomiting. No associated gross hematuria.    Past Medical History:  Diagnosis Date   Complication of anesthesia    As a child, woke up "yelling."   Diabetes mellitus    DVT (deep venous thrombosis) (HCC) 08/2009   left lower extremity and had a DVT inthe left posterior tibial vein   Hypertension    Mixed hyperlipidemia    low HDL   Murmur    SOB (shortness of breath) 08/01/2010   2D Echo EF >55%    Past Surgical History:  Procedure Laterality Date   CATARACT EXTRACTION, BILATERAL     COLONOSCOPY N/A 03/19/2015   Procedure: COLONOSCOPY;  Surgeon: Corbin Ade, MD;  Location: [facility];  Service: Endoscopy;  Laterality: N/A;  9:30 AM   KNEE ARTHROSCOPY  2012   left knee/torn meniscus   LEG SURGERY  2012   LLE/compartment syndrome after blood clot   RETINAL DETACHMENT SURGERY     x's 2   TONSILLECTOMY  1962    Home Medications:  Allergies as of 08/13/2021       Reactions   Coumadin [warfarin]    Warfarin And Related    Compartment syndrome        Medication List        Accurate as of August 11, 2021  8:42 PM. If you have any questions, ask your nurse or doctor.          amLODipine 2.5 MG tablet Commonly known as: NORVASC Take 1 tablet (2.5 mg total) by mouth daily.   aspirin 81 MG EC tablet Take 81 mg by mouth daily. Swallow whole.    atorvastatin 40 MG tablet Commonly known as: LIPITOR TAKE 1 TABLET BY MOUTH EVERY DAY   Combigan 0.2-0.5 % ophthalmic solution Generic drug: brimonidine-timolol INSTILL 1 DROP INTO BOTH EYES TWICE A DAY   cyclopentolate 1 % ophthalmic solution Commonly known as: CYCLODRYL,CYCLOGYL Place 1 drop into the left eye at bedtime.   glucosamine-chondroitin 500-400 MG tablet Take 1 tablet by mouth 2 (two) times daily.   irbesartan-hydrochlorothiazide 300-12.5 MG tablet Commonly known as: AVALIDE TAKE 1 TABLET BY MOUTH EVERY DAY   levothyroxine 200 MCG tablet Commonly known as: SYNTHROID Take 200 mcg by mouth daily.   metFORMIN 500 MG tablet Commonly known as: GLUCOPHAGE Take 1 tablet by mouth daily.   metoprolol succinate 50 MG 24 hr tablet Commonly known as: TOPROL-XL TAKE 1 TABLET BY MOUTH EVERY DAY   triamcinolone cream 0.1 % Commonly known as: KENALOG SMARTSIG:1 Application Topical 2-3 Times Daily        Allergies:  Allergies  Allergen Reactions   Coumadin [Warfarin]    Warfarin And Related     Compartment syndrome    Family History  Problem Relation Age of Onset   Cancer  Father 27       deceased   Heart disease Maternal Grandmother    Heart disease Paternal Grandfather     Social History:  reports that he has never smoked. He has never used smokeless tobacco. He reports current alcohol use. He reports that he does not use drugs.  ROS: A complete review of systems was performed.  All systems are negative except for pertinent findings as noted.  Physical Exam:  Vital signs in last 24 hours: There were no vitals taken for this visit. Constitutional:  Alert and oriented, No acute distress Cardiovascular: Regular rate  Respiratory: Normal respiratory effort GI: Abdomen is soft, nontender, nondistended, no abdominal masses. No CVAT.  Genitourinary: Normal male phallus, testes are descended bilaterally and non-tender and without masses, scrotum is normal in  appearance without lesions or masses, perineum is normal on inspection. Lymphatic: No lymphadenopathy Neurologic: Grossly intact, no focal deficits Psychiatric: Normal mood and affect  I have reviewed prior pt notes  I have reviewed notes from referring/previous physicians  I have reviewed urinalysis results  I have independently reviewed prior imaging  I have reviewed prior PSA results  I have reviewed prior urine culture   Impression/Assessment:  ***  Plan:  ***

## 2021-08-13 ENCOUNTER — Other Ambulatory Visit: Payer: Self-pay

## 2021-08-13 ENCOUNTER — Ambulatory Visit (INDEPENDENT_AMBULATORY_CARE_PROVIDER_SITE_OTHER): Payer: PPO | Admitting: Urology

## 2021-08-13 ENCOUNTER — Encounter: Payer: Self-pay | Admitting: Urology

## 2021-08-13 VITALS — BP 152/77 | HR 70

## 2021-08-13 DIAGNOSIS — N200 Calculus of kidney: Secondary | ICD-10-CM

## 2021-08-13 DIAGNOSIS — R972 Elevated prostate specific antigen [PSA]: Secondary | ICD-10-CM

## 2021-08-13 DIAGNOSIS — R3129 Other microscopic hematuria: Secondary | ICD-10-CM

## 2021-08-14 LAB — MICROSCOPIC EXAMINATION
Bacteria, UA: NONE SEEN
Epithelial Cells (non renal): NONE SEEN /hpf (ref 0–10)
Renal Epithel, UA: NONE SEEN /hpf
WBC, UA: NONE SEEN /hpf (ref 0–5)

## 2021-08-14 LAB — URINALYSIS, ROUTINE W REFLEX MICROSCOPIC
Bilirubin, UA: NEGATIVE
Glucose, UA: NEGATIVE
Ketones, UA: NEGATIVE
Leukocytes,UA: NEGATIVE
Nitrite, UA: NEGATIVE
Protein,UA: NEGATIVE
Specific Gravity, UA: >1.03 — ABNORMAL HIGH (ref 1.005–1.030)
Urobilinogen, Ur: 0.2 mg/dL (ref 0.2–1.0)
pH, UA: 5.5 (ref 5.0–7.5)

## 2021-08-14 LAB — PSA: Prostate Specific Ag, Serum: 2 ng/mL (ref 0.0–4.0)

## 2021-08-15 ENCOUNTER — Telehealth: Payer: Self-pay

## 2021-08-15 NOTE — Telephone Encounter (Signed)
Patient called and made aware.

## 2021-08-15 NOTE — Telephone Encounter (Signed)
-----   Message from Franchot Gallo, MD sent at 08/15/2021  1:15 PM EDT ----- ?Let pt know psa nml--give # ?----- Message ----- ?From: Iris Pert, LPN ?Sent: 08/14/2021   8:17 AM EDT ?To: Franchot Gallo, MD ? ?Please review ?Pt has f/u on 4/21 ? ?

## 2021-08-20 ENCOUNTER — Other Ambulatory Visit: Payer: Self-pay

## 2021-08-20 ENCOUNTER — Ambulatory Visit (INDEPENDENT_AMBULATORY_CARE_PROVIDER_SITE_OTHER): Payer: PPO | Admitting: Ophthalmology

## 2021-08-20 ENCOUNTER — Encounter (INDEPENDENT_AMBULATORY_CARE_PROVIDER_SITE_OTHER): Payer: Self-pay | Admitting: Ophthalmology

## 2021-08-20 DIAGNOSIS — T85398A Other mechanical complication of other ocular prosthetic devices, implants and grafts, initial encounter: Secondary | ICD-10-CM | POA: Diagnosis not present

## 2021-08-20 DIAGNOSIS — H4042X0 Glaucoma secondary to eye inflammation, left eye, stage unspecified: Secondary | ICD-10-CM

## 2021-08-20 DIAGNOSIS — H43811 Vitreous degeneration, right eye: Secondary | ICD-10-CM

## 2021-08-20 DIAGNOSIS — H209 Unspecified iridocyclitis: Secondary | ICD-10-CM

## 2021-08-20 MED ORDER — BRIMONIDINE TARTRATE-TIMOLOL 0.2-0.5 % OP SOLN: 1.0000 [drp] | Freq: Two times a day (BID) | OPHTHALMIC | 5 refills | Status: AC

## 2021-08-20 MED ORDER — CYCLOPENTOLATE HCL 1 % OP SOLN: 1.0000 [drp] | Freq: Every day | OPHTHALMIC | 12 refills | Status: AC

## 2021-08-20 NOTE — Assessment & Plan Note (Addendum)
Observe no detectable DR today ?

## 2021-08-20 NOTE — Progress Notes (Signed)
Letter sent.

## 2021-08-20 NOTE — Assessment & Plan Note (Addendum)
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

## 2021-08-20 NOTE — Progress Notes (Signed)
? ? ?08/20/2021 ? ?  ? ?CHIEF COMPLAINT ?Patient presents for  ?Chief Complaint  ?Patient presents with  ? Posterior Vitreous Detachment  ? ?With history of uveitis glaucoma hyphema syndrome left eye, corrected since 2020 February post laser iridotomy over the site of iris/IOL or iris/summering's ring chafing OS at 6 meridian ? ? ? ?HISTORY OF PRESENT ILLNESS: ?Jordan Goodwin is a 71 y.o. male who presents to the clinic today for:  ? ?HPI   ?1 yr fu ou oct. ?Pt states no changes in vision. ?Pt denies floaters and FOL. ? ? ?Last edited by Silvestre Moment on 08/20/2021 11:15 AM.  ?  ? ? ?Referring physician: ?Sharilyn Sites, MD ?[address] ?[place],  [place] [address] ? ?HISTORICAL INFORMATION:  ? ?Selected notes from the [facility] ?  ? ?Lab Results  ?Component Value Date  ? HGBA1C 6.7 (H) 10/11/2013  ?  ? ?CURRENT MEDICATIONS: ?Current Outpatient Medications (Ophthalmic Drugs)  ?Medication Sig  ? COMBIGAN 0.2-0.5 % ophthalmic solution INSTILL 1 DROP INTO BOTH EYES TWICE A DAY  ? cyclopentolate (CYCLODRYL,CYCLOGYL) 1 % ophthalmic solution Place 1 drop into the left eye at bedtime.  ? ?No current facility-administered medications for this visit. (Ophthalmic Drugs)  ? ?Current Outpatient Medications (Other)  ?Medication Sig  ? amLODipine (NORVASC) 2.5 MG tablet Take 1 tablet (2.5 mg total) by mouth daily.  ? aspirin 81 MG EC tablet Take 81 mg by mouth daily. Swallow whole.  ? atorvastatin (LIPITOR) 40 MG tablet TAKE 1 TABLET BY MOUTH EVERY DAY  ? glucosamine-chondroitin 500-400 MG tablet Take 1 tablet by mouth 2 (two) times daily.  ? irbesartan-hydrochlorothiazide (AVALIDE) 300-12.5 MG tablet TAKE 1 TABLET BY MOUTH EVERY DAY  ? levothyroxine (SYNTHROID) 200 MCG tablet Take 200 mcg by mouth daily.  ? metFORMIN (GLUCOPHAGE) 500 MG tablet Take 1 tablet by mouth daily.  ? metoprolol succinate (TOPROL-XL) 50 MG 24 hr tablet TAKE 1 TABLET BY MOUTH EVERY DAY  ? ?No current facility-administered medications for this visit.  (Other)  ? ? ? ? ?REVIEW OF SYSTEMS: ?ROS   ?Negative for: Constitutional, Gastrointestinal, Neurological, Skin, Genitourinary, Musculoskeletal, HENT, Endocrine, Cardiovascular, Eyes, Respiratory, Psychiatric, Allergic/Imm, Heme/Lymph ?Last edited by Silvestre Moment on 08/20/2021 11:14 AM.  ?  ? ? ? ?ALLERGIES ?Allergies  ?Allergen Reactions  ? Coumadin [Warfarin]   ? Warfarin And Related   ?  Compartment syndrome  ? ? ?PAST MEDICAL HISTORY ?Past Medical History:  ?Diagnosis Date  ? Complication of anesthesia   ? As a child, woke up "yelling."  ? Diabetes mellitus   ? DVT (deep venous thrombosis) ([place]) 08/2009  ? left lower extremity and had a DVT inthe left posterior tibial vein  ? Hypertension   ? Mixed hyperlipidemia   ? low HDL  ? Murmur   ? SOB (shortness of breath) 08/01/2010  ? 2D Echo EF >55%  ? ?Past Surgical History:  ?Procedure Laterality Date  ? CATARACT EXTRACTION, BILATERAL    ? COLONOSCOPY N/A 03/19/2015  ? Procedure: COLONOSCOPY;  Surgeon: Daneil Dolin, MD;  [facility];  Service: Endoscopy;  Laterality: N/A;  9:30 AM  ? KNEE ARTHROSCOPY  2012  ? left knee/torn meniscus  ? LEG SURGERY  2012  ? LLE/compartment syndrome after blood clot  ? RETINAL DETACHMENT SURGERY    ? x's 2  ? TONSILLECTOMY  1962  ? ? ?FAMILY HISTORY ?Family History  ?Problem Relation Age of Onset  ? Cancer Father 109  ?  deceased  ? Heart disease Maternal Grandmother   ? Heart disease Paternal Grandfather   ? ? ?SOCIAL HISTORY ?Social History  ? ?Tobacco Use  ? Smoking status: Never  ? Smokeless tobacco: Never  ?Substance Use Topics  ? Alcohol use: Yes  ?  Comment: occasionally drinks beer,whiskey  ? Drug use: No  ? ?  ? ?  ? ?OPHTHALMIC EXAM: ? ?Base Eye Exam   ? ? Visual Acuity (ETDRS)   ? ?   Right Left  ? Dist cc 20/20 -1 20/20  ? ? Correction: Glasses  ? ?  ?  ? ? Tonometry (Tonopen, 11:19 AM)   ? ?   Right Left  ? Pressure 17 15  ? ?  ?  ? ? Pupils   ? ?   Pupils Dark Light Shape React APD  ? Right PERRL 3 2 Round Slow  None  ? Left PERRL 3 2 Irregular Slow None  ? ?  ?  ? ? Visual Fields   ? ?   Left Right  ?  Full Full  ? ?  ?  ? ? Extraocular Movement   ? ?   Right Left  ?  Full Full  ? ?  ?  ? ? Neuro/Psych   ? ? Oriented x3: Yes  ? Mood/Affect: Normal  ? ?  ?  ? ? Dilation   ? ? Both eyes: 1.0% Mydriacyl, 2.5% Phenylephrine @ 11:19 AM  ? ?  ?  ? ?  ? ?Slit Lamp and Fundus Exam   ? ? External Exam   ? ?   Right Left  ? External Normal Normal  ? ?  ?  ? ? Slit Lamp Exam   ? ?   Right Left  ? Lids/Lashes Normal Normal  ? Conjunctiva/Sclera White and quiet White and quiet  ? Cornea Clear Clear  ? Anterior Chamber Deep and quiet Deep and quiet  ? Iris Round and reactive PI at 6, patent, placed over site of previous iris haptic or iris summering's ring chafing leading to micro hyphema recurrences in the past  ? Lens Centered posterior chamber intraocular lens Centered posterior chamber intraocular lens  ? Anterior Vitreous Normal Normal  ? ?  ?  ? ? Fundus Exam   ? ?   Right Left  ? Posterior Vitreous Clear vitrectomized Clear vitrectomized  ? Disc Normal Normal  ? C/D Ratio 0.5 0.45  ? Macula Normal Normal  ? Vessels Normal, no DR Normal, no DR  ? Periphery Good scleral buckle, retinal cryopexy a attached Normal, good laser retinopexy to regions of vascular nonperfusion in the past temporally  ? ?  ?  ? ?  ? ? ?IMAGING AND PROCEDURES  ?Imaging and Procedures for 08/20/21 ? ?OCT, Retina - OU - Both Eyes   ? ?   ?Right Eye ?Quality was good. Scan locations included subfoveal. Central Foveal Thickness: 299. Progression has been stable. Findings include normal foveal contour.  ? ?Left Eye ?Quality was good. Scan locations included subfoveal. Central Foveal Thickness: 296. Progression has been stable.  ? ?  ? ? ?  ?  ? ?  ?ASSESSMENT/PLAN: ? ?Uveitis-hyphema-glaucoma syndrome of left eye ?PI at 6, patent, placed over site of previous iris haptic or iris summering's ring chafing leading to micro hyphema recurrences in the past, no signs  of ongoing chafe, no symptoms of micro hyphema has occurred in the past ? ?Primary angle-closure glaucoma, moderate stage ?Follow-up with [facility] as scheduled ? ?Diabetes type 2, controlled ([place]) ?Observe no detectable DR today  ? ?  ICD-10-CM   ?1. Posterior vitreous detachment of right eye  H43.811 OCT, Retina - OU - Both Eyes  ?  ?2. Uveitis-hyphema-glaucoma syndrome of left eye  T85.398A   ? H20.9   ? H40.42X0   ?  ? ? ?1. ? ?2. ? ?3. ? ?Ophthalmic Meds Ordered this visit:  ?No orders of the defined types were placed in this encounter. ? ? ?  ? ?No follow-ups on file. ? ?There are no Patient Instructions on file for this visit. ? ? ?Explained the diagnoses, plan, and follow up with the patient and they expressed understanding.  Patient expressed understanding of the importance of proper follow up care.  ? ?Clent Demark. Flor Houdeshell M.D. ?Diseases & Surgery of the Retina and Vitreous ?[facility] ?08/20/21 ? ? ? ? ?Abbreviations: ?M myopia (nearsighted); A astigmatism; H hyperopia (farsighted); P presbyopia; Mrx spectacle prescription;  CTL contact lenses; OD right eye; OS left eye; OU both eyes  XT exotropia; ET esotropia; PEK punctate epithelial keratitis; PEE punctate epithelial erosions; DES dry eye syndrome; MGD meibomian gland dysfunction; ATs artificial tears; PFAT's preservative free artificial tears; [place] nuclear sclerotic cataract; PSC posterior subcapsular cataract; ERM epi-retinal membrane; PVD posterior vitreous detachment; RD retinal detachment; DM diabetes mellitus; DR diabetic retinopathy; NPDR non-proliferative diabetic retinopathy; PDR proliferative diabetic retinopathy; CSME clinically significant macular edema; DME diabetic macular edema; dbh dot blot hemorrhages; CWS cotton wool spot; POAG primary open angle glaucoma; C/D cup-to-disc ratio; HVF humphrey visual field; GVF goldmann visual field; OCT optical coherence tomography; IOP intraocular pressure; BRVO Branch retinal vein  occlusion; CRVO central retinal vein occlusion; CRAO central retinal artery occlusion; BRAO branch retinal artery occlusion; RT retinal tear; SB scleral buckle; PPV pars plana vitrectomy; VH Vitreous

## 2021-08-20 NOTE — Assessment & Plan Note (Signed)
Follow-up with [facility] as scheduled 

## 2021-08-20 NOTE — Patient Instructions (Signed)
Cyclogyl 1 drop left eye nightly to prevent iris chafe ? ?Combigan 1 drop twice daily left eye ?

## 2021-09-10 ENCOUNTER — Other Ambulatory Visit: Payer: PPO

## 2021-09-10 DIAGNOSIS — R3129 Other microscopic hematuria: Secondary | ICD-10-CM

## 2021-09-10 LAB — URINALYSIS, ROUTINE W REFLEX MICROSCOPIC
Bilirubin, UA: NEGATIVE
Glucose, UA: NEGATIVE
Ketones, UA: NEGATIVE
Leukocytes,UA: NEGATIVE
Nitrite, UA: NEGATIVE
Protein,UA: NEGATIVE
Specific Gravity, UA: >1.03 — ABNORMAL HIGH (ref 1.005–1.030)
Urobilinogen, Ur: 0.2 mg/dL (ref 0.2–1.0)
pH, UA: 5.5 (ref 5.0–7.5)

## 2021-09-10 LAB — MICROSCOPIC EXAMINATION
Bacteria, UA: NONE SEEN
Epithelial Cells (non renal): NONE SEEN /hpf (ref 0–10)
Renal Epithel, UA: NONE SEEN /hpf
WBC, UA: NONE SEEN /hpf (ref 0–5)

## 2021-09-12 LAB — URINE CULTURE: Organism ID, Bacteria: NO GROWTH

## 2021-09-16 ENCOUNTER — Other Ambulatory Visit: Payer: Self-pay | Admitting: Urology

## 2021-09-16 DIAGNOSIS — R3129 Other microscopic hematuria: Secondary | ICD-10-CM

## 2021-09-17 ENCOUNTER — Telehealth: Payer: Self-pay

## 2021-09-17 NOTE — Addendum Note (Signed)
Addended byIris Pert on: 09/17/2021 04:42 PM ? ? Modules accepted: Orders ? ?

## 2021-09-17 NOTE — Telephone Encounter (Signed)
Patient called and made aware. Cysto appointment made. 

## 2021-09-17 NOTE — Telephone Encounter (Signed)
-----   Message from Marcine Matar, MD sent at 09/16/2021  1:23 PM EDT ----- ?Notify pt--stilll has blood in rine--needs ct/cysto. I put CT orders in, also needs OV/cysto ?----- Message ----- ?From: Gustavus Messing, LPN ?Sent: 09/12/2021   8:09 AM EDT ?To: Marcine Matar, MD ? ?Per last OV repeat ua/cx ? ?

## 2021-09-27 ENCOUNTER — Ambulatory Visit (HOSPITAL_COMMUNITY)
Admission: RE | Admit: 2021-09-27 | Discharge: 2021-09-27 | Disposition: A | Discharge status: Home / Self Care | Payer: PPO | Source: Ambulatory Visit | Attending: Urology | Admitting: Urology

## 2021-09-27 ENCOUNTER — Encounter (HOSPITAL_COMMUNITY): Payer: Self-pay

## 2021-09-27 DIAGNOSIS — R3129 Other microscopic hematuria: Secondary | ICD-10-CM | POA: Insufficient documentation

## 2021-09-27 DIAGNOSIS — K5730 Diverticulosis of large intestine without perforation or abscess without bleeding: Secondary | ICD-10-CM | POA: Diagnosis not present

## 2021-09-27 DIAGNOSIS — N200 Calculus of kidney: Secondary | ICD-10-CM | POA: Diagnosis not present

## 2021-09-27 DIAGNOSIS — N281 Cyst of kidney, acquired: Secondary | ICD-10-CM | POA: Diagnosis not present

## 2021-09-27 DIAGNOSIS — N3289 Other specified disorders of bladder: Secondary | ICD-10-CM | POA: Diagnosis not present

## 2021-09-27 LAB — POCT I-STAT CREATININE: Creatinine, Ser: 1.1 mg/dL (ref 0.61–1.24)

## 2021-09-27 MED ORDER — IOHEXOL 300 MG/ML  SOLN
100.0000 mL | Freq: Once | INTRAMUSCULAR | Status: AC | PRN
  Administered 2021-09-27: 100 mL via INTRAVENOUS

## 2021-10-01 ENCOUNTER — Encounter: Payer: Self-pay | Admitting: Urology

## 2021-10-01 ENCOUNTER — Ambulatory Visit: Payer: PPO | Admitting: Urology

## 2021-10-01 VITALS — BP 168/85 | HR 68

## 2021-10-01 DIAGNOSIS — N400 Enlarged prostate without lower urinary tract symptoms: Secondary | ICD-10-CM | POA: Diagnosis not present

## 2021-10-01 DIAGNOSIS — R972 Elevated prostate specific antigen [PSA]: Secondary | ICD-10-CM | POA: Diagnosis not present

## 2021-10-01 DIAGNOSIS — R3129 Other microscopic hematuria: Secondary | ICD-10-CM

## 2021-10-01 DIAGNOSIS — Q632 Ectopic kidney: Secondary | ICD-10-CM | POA: Diagnosis not present

## 2021-10-01 LAB — MICROSCOPIC EXAMINATION
Bacteria, UA: NONE SEEN
Epithelial Cells (non renal): NONE SEEN /hpf (ref 0–10)
Renal Epithel, UA: NONE SEEN /hpf
WBC, UA: NONE SEEN /hpf (ref 0–5)

## 2021-10-01 LAB — URINALYSIS, ROUTINE W REFLEX MICROSCOPIC
Bilirubin, UA: NEGATIVE
Glucose, UA: NEGATIVE
Ketones, UA: NEGATIVE
Leukocytes,UA: NEGATIVE
Nitrite, UA: NEGATIVE
Protein,UA: NEGATIVE
Specific Gravity, UA: 1.025 (ref 1.005–1.030)
Urobilinogen, Ur: 0.2 mg/dL (ref 0.2–1.0)
pH, UA: 5.5 (ref 5.0–7.5)

## 2021-10-01 MED ORDER — CIPROFLOXACIN HCL 500 MG PO TABS
500.0000 mg | ORAL_TABLET | Freq: Once | ORAL | Status: AC
  Administered 2021-10-01: 500 mg via ORAL

## 2021-10-01 NOTE — Progress Notes (Signed)
History of Present Illness: This gentleman returns for cystoscopy to complete microscopic hematuria evaluation. ? ?Recent CT was performed.  This revealed, once again an ectopic/pelvic left kidney.  Punctate calculi within the left kidney.  No other renal abnormalities.  Thick-walled bladder.  Prostate enlargement.  Other incidental findings noted, stable. ? ?Past Medical History:  ?Diagnosis Date  ? Complication of anesthesia   ? As a child, woke up "yelling."  ? Diabetes mellitus   ? DVT (deep venous thrombosis) ([facility]) 08/2009  ? left lower extremity and had a DVT inthe left posterior tibial vein  ? Hypertension   ? Mixed hyperlipidemia   ? low HDL  ? Murmur   ? SOB (shortness of breath) 08/01/2010  ? 2D Echo EF >55%  ? ? ?Past Surgical History:  ?Procedure Laterality Date  ? CATARACT EXTRACTION, BILATERAL    ? COLONOSCOPY N/A 03/19/2015  ? Procedure: COLONOSCOPY;  Surgeon: Corbin Ade, MD;  [facility];  Service: Endoscopy;  Laterality: N/A;  9:30 AM  ? KNEE ARTHROSCOPY  2012  ? left knee/torn meniscus  ? LEG SURGERY  2012  ? LLE/compartment syndrome after blood clot  ? RETINAL DETACHMENT SURGERY    ? x's 2  ? TONSILLECTOMY  1962  ? ? ?Home Medications:  ?Allergies as of 10/01/2021   ? ?   Reactions  ? Coumadin [warfarin]   ? Warfarin And Related   ? Compartment syndrome  ? ?  ? ?  ?Medication List  ?  ? ?  ? Accurate as of Oct 01, 2021 11:35 AM. If you have any questions, ask your nurse or doctor.  ?  ?  ? ?  ? ?amLODipine 2.5 MG tablet ?Commonly known as: NORVASC ?Take 1 tablet (2.5 mg total) by mouth daily. ?  ?aspirin 81 MG EC tablet ?Take 81 mg by mouth daily. Swallow whole. ?  ?atorvastatin 40 MG tablet ?Commonly known as: LIPITOR ?TAKE 1 TABLET BY MOUTH EVERY DAY ?  ?brimonidine-timolol 0.2-0.5 % ophthalmic solution ?Commonly known as: Combigan ?Place 1 drop into both eyes 2 (two) times daily. ?  ?cyclopentolate 1 % ophthalmic solution ?Commonly known as: CYCLODRYL,CYCLOGYL ?Place 1 drop into  the left eye at bedtime. ?  ?glucosamine-chondroitin 500-400 MG tablet ?Take 1 tablet by mouth 2 (two) times daily. ?  ?irbesartan-hydrochlorothiazide 300-12.5 MG tablet ?Commonly known as: AVALIDE ?TAKE 1 TABLET BY MOUTH EVERY DAY ?  ?levothyroxine 200 MCG tablet ?Commonly known as: SYNTHROID ?Take 200 mcg by mouth daily. ?  ?metFORMIN 500 MG tablet ?Commonly known as: GLUCOPHAGE ?Take 1 tablet by mouth daily. ?  ?metoprolol succinate 50 MG 24 hr tablet ?Commonly known as: TOPROL-XL ?TAKE 1 TABLET BY MOUTH EVERY DAY ?  ? ?  ? ? ?Allergies:  ?Allergies  ?Allergen Reactions  ? Coumadin [Warfarin]   ? Warfarin And Related   ?  Compartment syndrome  ? ? ?Family History  ?Problem Relation Age of Onset  ? Cancer Father 35  ?     deceased  ? Heart disease Maternal Grandmother   ? Heart disease Paternal Grandfather   ? ? ?Social History:  reports that he has never smoked. He has never used smokeless tobacco. He reports current alcohol use. He reports that he does not use drugs. ? ?ROS: ?A complete review of systems was performed.  All systems are negative except for pertinent findings as noted. ? ?Physical Exam:  ?Vital signs in last 24 hours: ?BP (!) 168/85   Pulse 68  ?  Constitutional:  Alert and oriented, No acute distress ?Cardiovascular: Regular rate  ?Respiratory: Normal respiratory effort ?GI: Abdomen is soft, nontender, nondistended, no abdominal masses. No CVAT.  ?Genitourinary: Normal male phallus, testes are descended bilaterally and non-tender and without masses, scrotum is normal in appearance without lesions or masses, perineum is normal on inspection. ?Lymphatic: No lymphadenopathy ?Neurologic: Grossly intact, no focal deficits ?Psychiatric: Normal mood and affect ? ?I have reviewed prior pt notes ? ?I have reviewed urinalysis results ? ?I have independently reviewed prior imaging--CT images reviewed ? ?I have reviewed prior PSA results--most recently 2.0 ? ?Cystoscopy Procedure Note: ? ?Indication:  Microscopic hematuria evaluation ? ?After informed consent and discussion of the procedure and its risks, KRISTINE TILEY was positioned and prepped in the standard fashion.  ?Cystoscopy was performed with a flexible cystoscope.   ?Findings: ?Urethra: No lesions/stricture ?Prostate: Moderately obstructive ?Bladder neck: Normal ?Ureteral orifices: Normal bilaterally ?Bladder: No urothelial lesions.  No trabeculations. ? ?The patient tolerated the procedure well. ? ? ? ? ?Impression/Assessment:  ?1.  Microscopic hematuria with negative CT/cystoscopy ? ?2.  History of elevated PSA, normal PSA recently, normal DRE ? ?3.  BPH by size criteria, minimally symptomatic ? ?Plan:  ?Patient and wife reassured about his findings ? ?I will see back in a year to recheck ? ?

## 2021-10-14 DIAGNOSIS — M545 Low back pain, unspecified: Secondary | ICD-10-CM | POA: Diagnosis not present

## 2021-10-14 DIAGNOSIS — M25552 Pain in left hip: Secondary | ICD-10-CM | POA: Diagnosis not present

## 2021-11-19 ENCOUNTER — Other Ambulatory Visit: Payer: Self-pay | Admitting: Cardiovascular Disease

## 2021-11-28 DIAGNOSIS — U071 COVID-19: Secondary | ICD-10-CM | POA: Diagnosis not present

## 2021-11-28 DIAGNOSIS — Z20822 Contact with and (suspected) exposure to covid-19: Secondary | ICD-10-CM | POA: Diagnosis not present

## 2022-01-25 ENCOUNTER — Other Ambulatory Visit: Payer: Self-pay | Admitting: Cardiovascular Disease

## 2022-02-01 ENCOUNTER — Other Ambulatory Visit: Payer: Self-pay | Admitting: Cardiovascular Disease

## 2022-02-23 ENCOUNTER — Other Ambulatory Visit: Payer: Self-pay | Admitting: Cardiovascular Disease

## 2022-04-10 DIAGNOSIS — N400 Enlarged prostate without lower urinary tract symptoms: Secondary | ICD-10-CM | POA: Diagnosis not present

## 2022-04-10 DIAGNOSIS — E663 Overweight: Secondary | ICD-10-CM | POA: Diagnosis not present

## 2022-04-10 DIAGNOSIS — Z1331 Encounter for screening for depression: Secondary | ICD-10-CM | POA: Diagnosis not present

## 2022-04-10 DIAGNOSIS — G4733 Obstructive sleep apnea (adult) (pediatric): Secondary | ICD-10-CM | POA: Diagnosis not present

## 2022-04-10 DIAGNOSIS — Z6826 Body mass index (BMI) 26.0-26.9, adult: Secondary | ICD-10-CM | POA: Diagnosis not present

## 2022-04-10 DIAGNOSIS — Z0001 Encounter for general adult medical examination with abnormal findings: Secondary | ICD-10-CM | POA: Diagnosis not present

## 2022-04-10 DIAGNOSIS — E119 Type 2 diabetes mellitus without complications: Secondary | ICD-10-CM | POA: Diagnosis not present

## 2022-04-10 DIAGNOSIS — E7849 Other hyperlipidemia: Secondary | ICD-10-CM | POA: Diagnosis not present

## 2022-04-10 DIAGNOSIS — E782 Mixed hyperlipidemia: Secondary | ICD-10-CM | POA: Diagnosis not present

## 2022-04-10 DIAGNOSIS — E039 Hypothyroidism, unspecified: Secondary | ICD-10-CM | POA: Diagnosis not present

## 2022-04-10 DIAGNOSIS — I10 Essential (primary) hypertension: Secondary | ICD-10-CM | POA: Diagnosis not present

## 2022-05-14 ENCOUNTER — Other Ambulatory Visit: Payer: Self-pay | Admitting: Cardiovascular Disease

## 2022-06-13 ENCOUNTER — Other Ambulatory Visit: Payer: Self-pay | Admitting: Cardiovascular Disease

## 2022-08-11 ENCOUNTER — Other Ambulatory Visit: Payer: Self-pay | Admitting: Cardiovascular Disease

## 2022-08-11 NOTE — Progress Notes (Signed)
History of Present Illness:  Jordan Goodwin returns for continued follow up of the below issues:  ELEVATED PSA  He initially presented in 2011 with a PSA of 5.25. This decreased to below 3 shortly after his first visit. It was felt that he had a UTI. His PSA has been normal since that time.   3.14.2023: PSA 2.0     RENAL  CYST:    Right renal cyst seen on recent CT scan. CT scan was performed for left-sided back pain with hematuria.   He has had no recent pain or hematuria.   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 No ureteral stones. Pain was not crampy in nature. Not associated with nausea and vomiting. No associated gross hematuria.  MICROSCOPIC HEMATURIA: Benign evaluation in the past.   3.12.2024: No recent PSA. IPSS 5  QoL score 1    Past Medical History:  Diagnosis Date   Complication of anesthesia    As a child, woke up "yelling."   Diabetes mellitus    DVT (deep venous thrombosis) ([place]) 08/2009   left lower extremity and had a DVT inthe left posterior tibial vein   Hypertension    Mixed hyperlipidemia    low HDL   Murmur    SOB (shortness of breath) 08/01/2010   2D Echo EF >55%    Past Surgical History:  Procedure Laterality Date   CATARACT EXTRACTION, BILATERAL     COLONOSCOPY N/A 03/19/2015   Procedure: COLONOSCOPY;  Surgeon: Daneil Dolin, MD;  [facility];  Service: Endoscopy;  Laterality: N/A;  9:30 AM   KNEE ARTHROSCOPY  2012   left knee/torn meniscus   LEG SURGERY  2012   LLE/compartment syndrome after blood clot   RETINAL DETACHMENT SURGERY     x's 2   TONSILLECTOMY  1962    Home Medications:  Allergies as of 08/12/2022       Reactions   Coumadin [warfarin]    Warfarin And Related    Compartment syndrome        Medication List        Accurate as of August 11, 2022  8:34 AM. If you have any  questions, ask your nurse or doctor.          amLODipine 2.5 MG tablet Commonly known as: NORVASC TAKE 1 TABLET BY MOUTH EVERY DAY   aspirin EC 81 MG tablet Take 81 mg by mouth daily. Swallow whole.   atorvastatin 40 MG tablet Commonly known as: LIPITOR TAKE 1 TABLET DAILY. PATIENT MUST MAKE APPOINTMENT FOR FUTURE REFILLS FIRST ATTEMPT   brimonidine-timolol 0.2-0.5 % ophthalmic solution Commonly known as: Combigan Place 1 drop into both eyes 2 (two) times daily.   cyclopentolate 1 % ophthalmic solution Commonly known as: CYCLODRYL,CYCLOGYL Place 1 drop into the left eye at bedtime.   glucosamine-chondroitin 500-400 MG tablet Take 1 tablet by mouth 2 (two) times daily.   irbesartan-hydrochlorothiazide 300-12.5 MG tablet Commonly known as: AVALIDE TAKE 1 TABLET BY MOUTH EVERY DAY   levothyroxine 200 MCG tablet Commonly known as: SYNTHROID Take 200 mcg by mouth daily.   metFORMIN 500 MG tablet Commonly known as: GLUCOPHAGE Take 1 tablet by mouth daily.   metoprolol succinate 50 MG 24 hr tablet Commonly known as: TOPROL-XL Take 1 tablet (50 mg total) by mouth daily. Patient must make appointment for future refills first attempt  Allergies:  Allergies  Allergen Reactions   Coumadin [Warfarin]    Warfarin And Related     Compartment syndrome    Family History  Problem Relation Age of Onset   Cancer Father 66       deceased   Heart disease Maternal Grandmother    Heart disease Paternal Grandfather     Social History:  reports that he has never smoked. He has never used smokeless tobacco. He reports current alcohol use. He reports that he does not use drugs.  ROS: A complete review of systems was performed.  All systems are negative except for pertinent findings as noted.  Physical Exam:  Vital signs in last 24 hours: There were no vitals taken for this visit. Constitutional:  Alert and oriented, No acute distress Cardiovascular: Regular rate   Respiratory: Normal respiratory effort GI:  No inguinal hernias. Genitourinary: Normal male phallus, testes are descended bilaterally and non-tender and without masses, scrotum is normal in appearance without lesions or masses, perineum is normal on inspection.  Prostate 50 g, symmetric, nonnodular, nontender. Lymphatic: No lymphadenopathy Neurologic: Grossly intact, no focal deficits Psychiatric: Normal mood and affect  I have reviewed prior pt notes  I have reviewed urinalysis results  I have independently reviewed prior imaging-CT scan from 2023  I have reviewed prior PSA results  I have reviewed IPSS sheet   Impression/Assessment:  1.  BPH with benign exam, minimal symptoms  2.  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  PSA checked today  2.  I will have him come back in 1 year for repeat check

## 2022-08-12 ENCOUNTER — Ambulatory Visit (INDEPENDENT_AMBULATORY_CARE_PROVIDER_SITE_OTHER): Payer: PPO | Admitting: Urology

## 2022-08-12 ENCOUNTER — Encounter: Payer: Self-pay | Admitting: Urology

## 2022-08-12 VITALS — BP 133/76 | HR 66

## 2022-08-12 DIAGNOSIS — Q632 Ectopic kidney: Secondary | ICD-10-CM

## 2022-08-12 DIAGNOSIS — R3129 Other microscopic hematuria: Secondary | ICD-10-CM

## 2022-08-12 DIAGNOSIS — N200 Calculus of kidney: Secondary | ICD-10-CM

## 2022-08-12 DIAGNOSIS — R972 Elevated prostate specific antigen [PSA]: Secondary | ICD-10-CM | POA: Diagnosis not present

## 2022-08-12 DIAGNOSIS — N401 Enlarged prostate with lower urinary tract symptoms: Secondary | ICD-10-CM | POA: Diagnosis not present

## 2022-08-12 DIAGNOSIS — N400 Enlarged prostate without lower urinary tract symptoms: Secondary | ICD-10-CM | POA: Diagnosis not present

## 2022-08-12 LAB — URINALYSIS, ROUTINE W REFLEX MICROSCOPIC
Bilirubin, UA: NEGATIVE
Glucose, UA: NEGATIVE
Ketones, UA: NEGATIVE
Leukocytes,UA: NEGATIVE
Nitrite, UA: NEGATIVE
Protein,UA: NEGATIVE
Specific Gravity, UA: 1.03 (ref 1.005–1.030)
Urobilinogen, Ur: 0.2 mg/dL (ref 0.2–1.0)
pH, UA: 5 (ref 5.0–7.5)

## 2022-08-12 LAB — MICROSCOPIC EXAMINATION
Bacteria, UA: NONE SEEN
Epithelial Cells (non renal): NONE SEEN /hpf (ref 0–10)

## 2022-08-13 LAB — PSA: Prostate Specific Ag, Serum: 1.9 ng/mL (ref 0.0–4.0)

## 2022-08-18 ENCOUNTER — Telehealth: Payer: Self-pay

## 2022-08-18 NOTE — Telephone Encounter (Signed)
Patient is aware that psa is stable. Patient voiced understanding

## 2022-08-18 NOTE — Telephone Encounter (Signed)
-----   Message from Franchot Gallo, MD sent at 08/18/2022 11:10 AM EDT ----- Notify pt psa stable ----- Message ----- From: Sherrilyn Rist, CMA Sent: 08/14/2022   9:23 AM EDT To: Franchot Gallo, MD  Please review

## 2022-08-21 ENCOUNTER — Encounter (INDEPENDENT_AMBULATORY_CARE_PROVIDER_SITE_OTHER): Payer: PPO | Admitting: Ophthalmology

## 2022-08-21 DIAGNOSIS — H43811 Vitreous degeneration, right eye: Secondary | ICD-10-CM | POA: Diagnosis not present

## 2022-08-21 DIAGNOSIS — H4020X2 Unspecified primary angle-closure glaucoma, moderate stage: Secondary | ICD-10-CM | POA: Diagnosis not present

## 2022-08-21 DIAGNOSIS — E119 Type 2 diabetes mellitus without complications: Secondary | ICD-10-CM | POA: Diagnosis not present

## 2022-08-21 DIAGNOSIS — Z9889 Other specified postprocedural states: Secondary | ICD-10-CM | POA: Diagnosis not present

## 2022-09-03 ENCOUNTER — Other Ambulatory Visit: Payer: Self-pay | Admitting: Cardiovascular Disease

## 2022-09-12 ENCOUNTER — Other Ambulatory Visit: Payer: Self-pay | Admitting: Cardiovascular Disease

## 2022-09-16 DIAGNOSIS — M720 Palmar fascial fibromatosis [Dupuytren]: Secondary | ICD-10-CM | POA: Diagnosis not present

## 2022-09-27 ENCOUNTER — Other Ambulatory Visit: Payer: Self-pay | Admitting: Cardiovascular Disease

## 2022-10-03 ENCOUNTER — Other Ambulatory Visit: Payer: Self-pay | Admitting: Cardiovascular Disease

## 2022-11-09 ENCOUNTER — Other Ambulatory Visit: Payer: Self-pay | Admitting: Cardiovascular Disease

## 2022-12-07 ENCOUNTER — Other Ambulatory Visit: Payer: Self-pay | Admitting: Cardiovascular Disease

## 2022-12-16 ENCOUNTER — Other Ambulatory Visit: Payer: Self-pay | Admitting: Cardiovascular Disease

## 2022-12-31 ENCOUNTER — Other Ambulatory Visit: Payer: Self-pay | Admitting: Cardiovascular Disease

## 2023-01-08 ENCOUNTER — Other Ambulatory Visit: Payer: Self-pay | Admitting: Cardiovascular Disease

## 2023-01-14 DIAGNOSIS — E782 Mixed hyperlipidemia: Secondary | ICD-10-CM | POA: Diagnosis not present

## 2023-01-14 DIAGNOSIS — E039 Hypothyroidism, unspecified: Secondary | ICD-10-CM | POA: Diagnosis not present

## 2023-01-14 DIAGNOSIS — E118 Type 2 diabetes mellitus with unspecified complications: Secondary | ICD-10-CM | POA: Diagnosis not present

## 2023-01-14 DIAGNOSIS — E785 Hyperlipidemia, unspecified: Secondary | ICD-10-CM | POA: Diagnosis not present

## 2023-01-14 DIAGNOSIS — I10 Essential (primary) hypertension: Secondary | ICD-10-CM | POA: Diagnosis not present

## 2023-01-14 DIAGNOSIS — Z6826 Body mass index (BMI) 26.0-26.9, adult: Secondary | ICD-10-CM | POA: Diagnosis not present

## 2023-01-14 DIAGNOSIS — E1129 Type 2 diabetes mellitus with other diabetic kidney complication: Secondary | ICD-10-CM | POA: Diagnosis not present

## 2023-01-14 DIAGNOSIS — E663 Overweight: Secondary | ICD-10-CM | POA: Diagnosis not present

## 2023-01-20 DIAGNOSIS — M25512 Pain in left shoulder: Secondary | ICD-10-CM | POA: Diagnosis not present

## 2023-02-27 ENCOUNTER — Other Ambulatory Visit: Payer: Self-pay | Admitting: Cardiovascular Disease

## 2023-03-17 ENCOUNTER — Other Ambulatory Visit: Payer: Self-pay | Admitting: Cardiovascular Disease

## 2023-03-18 NOTE — Telephone Encounter (Signed)
Pt has not been seen in 2 years. Pharmacy sent this request and pt has had 3 attempts. Does Dr. Tresa Endo want to refill? Thank You.

## 2023-03-23 ENCOUNTER — Telehealth: Payer: Self-pay | Admitting: Cardiovascular Disease

## 2023-03-23 NOTE — Telephone Encounter (Signed)
*  STAT* If patient is at the pharmacy, call can be transferred to refill team.   1. Which medications need to be refilled? (please list name of each medication and dose if known) atorvastatin (LIPITOR) 40 MG tablet   2. Which pharmacy/location (including street and city if local pharmacy) is medication to be sent to? [id number] - [place], [place] - [address] AT [address]   3. Do they need a 30 day or 90 day supply? 90

## 2023-03-23 NOTE — Telephone Encounter (Signed)
Called pt and pt stated that he needed a refill on irbesartan and not atorvastatin. Pt said that he has enough of this medication and can wait until tomorrow, 03/24/23 for his appt with Dr. Tresa Endo to refill his medication. I advised the pt that if he has any other problems, questions or concerns, to give our office a call back. Pt verbalized understanding.

## 2023-03-24 ENCOUNTER — Encounter: Payer: Self-pay | Admitting: Cardiovascular Disease

## 2023-03-24 ENCOUNTER — Ambulatory Visit: Payer: PPO | Attending: Cardiovascular Disease | Admitting: Cardiovascular Disease

## 2023-03-24 VITALS — BP 122/80 | HR 67 | Ht 75.0 in | Wt 203.0 lb

## 2023-03-24 DIAGNOSIS — R011 Cardiac murmur, unspecified: Secondary | ICD-10-CM | POA: Diagnosis not present

## 2023-03-24 DIAGNOSIS — E785 Hyperlipidemia, unspecified: Secondary | ICD-10-CM

## 2023-03-24 DIAGNOSIS — Z86718 Personal history of other venous thrombosis and embolism: Secondary | ICD-10-CM | POA: Diagnosis not present

## 2023-03-24 DIAGNOSIS — I10 Essential (primary) hypertension: Secondary | ICD-10-CM

## 2023-03-24 DIAGNOSIS — E119 Type 2 diabetes mellitus without complications: Secondary | ICD-10-CM | POA: Diagnosis not present

## 2023-03-24 NOTE — Patient Instructions (Signed)
Medication Instructions:  No change  *If you need a refill on your cardiac medications before your next appointment, please call your pharmacy*   Lab Work: None  If you have labs (blood work) drawn today and your tests are completely normal, you will receive your results only by: MyChart Message (if you have MyChart) OR A paper copy in the mail If you have any lab test that is abnormal or we need to change your treatment, we will call you to review the results.   Testing/Procedures: Your physician has requested that you have an echocardiogram. Echocardiography is a painless test that uses sound waves to create images of your heart. It provides your doctor with information about the size and shape of your heart and how well your heart's chambers and valves are working. This procedure takes approximately one hour. There are no restrictions for this procedure. Please do NOT wear cologne, perfume, aftershave, or lotions (deodorant is allowed). Please arrive 15 minutes prior to your appointment time.    Follow-Up: At [facility], you and your health needs are our priority.  As part of our continuing mission to provide you with exceptional heart care, we have created designated Provider Care Teams.  These Care Teams include your primary Cardiologist (physician) and Advanced Practice Providers (APPs -  Physician Assistants and Nurse Practitioners) who all work together to provide you with the care you need, when you need it.   Your next appointment:   1 year(s)  Provider:   You may see cardiology provider or the following Advanced Practice Provider on your designated Care Team:   Sharlene Dory, NP

## 2023-03-24 NOTE — Progress Notes (Signed)
Patient ID: Jordan Goodwin, male   DOB: [birth year], 72 y.o.   MRN: [id number]                                                                                                                                        PRIMARY MD: Dr. Phillips Odor  HPI: Jordan Goodwin is a 72 y.o. male who presents to the office for a 25 month followup evaluation.  Mr. Moretz has a history of left lower extremity DVT in April 2011 involving the left posterior tibial vein. He was treated with Lovenox and Coumadin and subsequently developed a calf hematoma leading to compartment syndrome requiring fasciotomy on 09/18/2009 and had a spontaneous pulmonary hemorrhage 09/22/2009. 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 He has history of diabetes mellitus and remotely he has had elevated PSAs felt  to be due to infection rather than cancer. Over the past year, he was taken off his Benicar 20 mg and this was substituted by his insurance company  with losartan 50 mg  titrated to 100 mg daily. Subsequently, this still did not control his blood pressure and he was switched to research and hydrochlorothiazide 300/12.5 mg.    An echo Doppler study in March 2012 with suggested mild to moderate aortic insufficiency.  Normal systolic function.  There was mild mitral regurgitation, mild tricuspid regurgitation.  Pulmonary pressure was normal.    He has a history of hypothyroidism and recently has been on Synthroid at 50 mcg.  He also has a history of hyperlipidemia, in addition to type 2 diabetes mellitus.  Laboratory last year revealed a total cholesterol of 134, HDL 43, triglycerides 48, LDL 81.  However, LDL particle number was increased at 1299.     When I saw him in April 2019  he was doing well.  He was exercising at least 3 days/week and working as an Art gallery manager at [employer].  He denied any chest pain, PND orthopnea or issues with leg  swelling.  He is followed by Dr. Phillips Odor.  He has a history of diabetes mellitus for approximately 10 years.  Laboratory from November 2018 showed hemoglobin A1c elevated at 7.6.  He has a history of hyperlipidemia.  His LDL cholesterol in October 2018 was still elevated at 92.  He has hypothyroidism and is on levothyroxine 175 mcg.   I saw him in July 2020 at which time he continued to do well.  He specifically denied any  chest pain or shortness of breath.  He was walking daily at least a mile.  He denies any exertional dyspnea.  He denies any significant leg swelling.  He was on irbesartan HCT 300/12.5 mg daily and Toprol-XL 50 mg daily.  He was diabetic on metformin, and continue to be on atorvastatin 40 mg for hyperlipidemia and levothyroxine for hypothyroidism.  I saw him in August 2021 and since his prior evaluation he continued to do well.   He is now retired and retired shortly before turning 67.  He has not been able to travel or go to the gym due to the COVID-19 pandemic but remains active.  He was on irbesartan HCT 300/12.5 mg in addition to Toprol-XL 50 mg.  Blood pressure at home has been stable.  He was unaware of palpitations or chest pain.  He continues to be on levothyroxine 200 mcg for hypothyroidism, Metformin now at 500 mg for diabetes and atorvastatin 40 mg.  Laboratory by Dr. Assunta Found on Oct 10, 2019 showed an LDL cholesterol at 69.  Hemoglobin A1c was 6.8.  TSH in June 2020 was 1.73.  He is followed by Dr. Marcine Matar for urologic health.  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    I last saw him on January 31, 2021 at which time he felt well and denied any chest pain or shortness of breath.  Recently, he has noticed his blood pressure to be mildly elevated.  He has continued to be on irbesartan HCT 300/12.5 mg and metoprolol succinate 50 mg daily.  He is on levothyroxine at 200 mcg for  hypothyroidism and atorvastatin 40 mg for hyperlipidemia.  He is diabetic on metformin.  Laboratory from November 05, 2020 was reviewed which showed an LDL cholesterol of 66.  Hemoglobin A1c was 6.8.  TSH was 3.36.    Since I last saw him, he has continued to feel well.  He exercises at least 3 days/week at [employer] where he walks at least 2 miles.  He sees Dr. Retta Diones for urologic care.  He is followed by Dr. Phillips Odor for primary care.  He is diabetic on Janumet 50/1000 mg and metformin.  He is no longer taking amlodipine but is taking metoprolol succinate 50 mg daily in addition to irbesartan HCT 300/12.5 mg for hypertension.  He takes atorvastatin 40 mg and a baby aspirin.  Laboratory by Dr. Assunta Found on January 15, 2023 showed total cholesterol 132, HDL 41, LDL 77 and triglycerides 70.  Hemoglobin A1c was elevated at 8.7.  TSH was normal at 1.74.  Creatinine was normal at 1.02.  He presents for evaluation.   Past Medical History  Diagnosis Date   Hypertension    Diabetes mellitus    Mixed hyperlipidemia     low HDL   SOB (shortness of breath) 08/01/2010    2D Echo EF >55%   DVT (deep venous thrombosis) 08/2009    left lower extremity and had a DVT inthe left posterior tibial vein   Murmur     Past Surgical History  Procedure Laterality Date   Tonsillectomy  1962   Retinal detachment surgery      x's 2   Cataract extraction, bilateral     Leg surgery  2012    LLE/compartment syndrome after blood clot   Knee arthroscopy  2012    left knee/torn meniscus    Allergies  Allergen Reactions   Warfarin And Related     Compartment syndrome    Current Outpatient Prescriptions  Medication Sig Dispense Refill   aspirin 81 MG EC tablet Take 81 mg by mouth daily. Swallow whole.       atorvastatin (LIPITOR) 20 MG tablet Take 20 mg by mouth daily.       glucosamine-chondroitin 500-400 MG tablet Take  1 tablet by mouth 2 (two) times daily.       irbesartan (AVAPRO) 300 MG tablet Take 1  tablet (300 mg total) by mouth at bedtime.  30 tablet  1   levothyroxine (SYNTHROID, LEVOTHROID) 50 MCG tablet Take 50 mcg by mouth daily before breakfast.       metoprolol succinate (TOPROL-XL) 50 MG 24 hr tablet Take 50 mg by mouth daily. Take with or immediately following a meal.       niacin (NIASPAN) 1000 MG CR tablet TAKE 1 TABLET BY MOUTH AT BEDTIME  90 tablet  3   sitaGLIPtin (JANUVIA) 100 MG tablet Take 100 mg by mouth daily.       No current facility-administered medications for this visit.   Socially he is married and has one child age 53. He exercises approximately 3-4 days per week. There is no tobacco history. He works as an Art gallery manager for [employer].   ROS General: Positive for fatigue; No fevers, chills, or night sweats;  HEENT: Negative; No changes in vision or hearing, sinus congestion, difficulty swallowing Pulmonary: Negative; No cough, wheezing, shortness of breath, hemoptysis Cardiovascular: Negative; No chest pain, presyncope, syncope, palpatations GI: Negative; No nausea, vomiting, diarrhea, or abdominal pain GU: Negative; No dysuria, hematuria, or difficulty voiding Musculoskeletal: Negative; no myalgias, joint pain, or weakness Hematologic/Oncology: Negative; no easy bruising, bleeding Endocrine: Positive for diabetes , and hypothyroidism; no heat/cold intolerance;  Neuro: Negative; no changes in balance, headaches Skin: Negative; No rashes or skin lesions Psychiatric: Negative; No behavioral problems, depression Sleep: Negative; No snoring, daytime sleepiness, hypersomnolence, bruxism, restless legs, hypnogognic hallucinations, no cataplexy Other comprehensive 14 point system review is negative.   PE BP 122/80   Pulse 67   Ht 6\' 3"  (1.905 m)   Wt 203 lb (92.1 kg)   SpO2 97%   BMI 25.37 kg/m    Repeat blood pressure by me was 124/72  Wt Readings from Last 3 Encounters:  03/24/23 203 lb (92.1 kg)  01/31/21 208 lb 9.6 oz (94.6 kg)  08/07/20 200 lb (90.7  kg)   General: Alert, oriented, no distress.  Skin: normal turgor, no rashes, warm and dry HEENT: Normocephalic, atraumatic. Pupils equal round and reactive to light; sclera anicteric; extraocular muscles intact;  Nose without nasal septal hypertrophy Mouth/Parynx benign; Mallinpatti scale 3 Neck: No JVD, no carotid bruits; normal carotid upstroke Lungs: clear to ausculatation and percussion; no wheezing or rales Chest wall: without tenderness to palpitation Heart: PMI not displaced, RRR, s1 s2 normal, 1/6 systolic murmur, no diastolic murmur, no rubs, gallops, thrills, or heaves Abdomen: soft, nontender; no hepatosplenomehaly, BS+; abdominal aorta nontender and not dilated by palpation. Back: no CVA tenderness Pulses 2+ Musculoskeletal: full range of motion, normal strength, no joint deformities Extremities: no clubbing cyanosis or edema, Homan's sign negative  Neurologic: grossly nonfocal; Cranial nerves grossly wnl Psychologic: Normal mood and affect   EKG Interpretation Date/Time:  Tuesday March 24 2023 11:12:44 EDT Ventricular Rate:  64 PR Interval:  200 QRS Duration:  106 QT Interval:  388 QTC Calculation: 400 R Axis:   76  Text Interpretation: Normal sinus rhythm PRWP V1-3 When compared with ECG of 20-Sep-2009 07:27, QRS voltage has increased Nonspecific T wave abnormality no longer evident in Inferior leads Nonspecific T wave abnormality, improved in Anterior leads Nonspecific T wave abnormality now evident in Lateral leads Confirmed by Nicki Guadalajara ([id number]) on 03/24/2023 12:12:04 PM    January 31, 2021 ECG (independently read by me): Sinus bradycardia  at 84, borderline LVH, first-degree AV block with PR interval at 218 ms  January 23, 2020 ECG (independently read by me): Sinus bradycardia 54 bpm.  Nonspecific T wave abnormality.  QT TC interval 396 ms.  PR interval 202 ms.  July 2020 ECG (independently read by me): Sinus Bradycardia at 59 with first degree AV block PR  interval  April 2019 ECG (independently read by me): Normal sinus rhythm at 60 bpm.  Borderline first-degree AV block with a PR segment 206 ms.  March 2018 ECG (independently read by me): On his bradycardia with first-degree AV block with a ventricular rate of 56 bpm.  PR interval 212 ms.  Nonspecific T changes.  November 2016 ECG (independently read by me): Sinus bradycardia 59 bpm.  First degree AV block with a PR interval 208 ms.  Nonspecific T changes.  May 2015 ECG : Sinus rhythm at 59 beats per minute.  Borderline first-degree block with PR interval 206 ms.  No significant ST changes.  Prior ECG: Sinus bradycardia 55 beats per minute.  Nonspecific T changes. Normal intervals.  LABS:     Latest Ref Rng & Units 09/27/2021   12:04 PM 10/11/2013    8:45 AM 05/02/2013    9:10 AM  BMP  Glucose 70 - 99 mg/dL  102  585   BUN 6 - 23 mg/dL  14  19   Creatinine 2.77 - 1.24 mg/dL 8.24  2.35  3.61   Sodium 135 - 145 mEq/L  141  140   Potassium 3.5 - 5.3 mEq/L  4.0  4.4   Chloride 96 - 112 mEq/L  105  105   CO2 19 - 32 mEq/L  28  25   Calcium 8.4 - 10.5 mg/dL  9.1  9.7       Latest Ref Rng & Units 10/11/2013    8:45 AM 11/14/2009   10:44 AM 09/23/2009    8:34 AM  Hepatic Function  Total Protein 6.0 - 8.3 g/dL 6.9  7.0  6.2   Albumin 3.5 - 5.2 g/dL 4.4  4.1  2.7   AST 0 - 37 U/L 20  23  40   ALT 0 - 53 U/L 17  19  66   Alk Phosphatase 39 - 117 U/L 46  52  63   Total Bilirubin 0.2 - 1.2 mg/dL 0.6  0.8  0.5       Latest Ref Rng & Units 10/11/2013    1:11 PM 02/11/2010    2:26 PM 11/14/2009    9:50 AM  CBC  WBC 4.0 - 10.5 K/uL 4.5  5.8  4.8   Hemoglobin 13.0 - 17.0 g/dL 44.3  15.4  00.8   Hematocrit 39.0 - 52.0 % 38.9  41.6  37.3   Platelets 150 - 400 K/uL 183  183  206    Lab Results  Component Value Date   MCV 68.7 (L) 10/11/2013   MCV 71.4 (L) 02/11/2010   MCV 73.5 (L) 11/14/2009   Lab Results  Component Value Date   TSH 6.283 (H) 11/14/2013   Lab Results   Component Value Date   HGBA1C 6.7 (H) 10/11/2013   Lipid Panel     Component Value Date/Time   CHOL 134 10/11/2013 1309   TRIG 48 10/11/2013 1309   HDL 43 10/11/2013 1309   LDLCALC 81 10/11/2013 1309    RADIOLOGY: No results found.  IMPRESSION:  1. Primary hypertension   2. Aortic valve murmur  3. Controlled type 2 diabetes mellitus without complication, without long-term current use of insulin (HCC)   4. History of DVT (deep vein thrombosis)   5. Hyperlipidemia with target LDL less than 70     ASSESSMENT AND PLAN:  Mr. Wichers is a 72 year-old gentleman who has a history of DVT and has not had any recurrence.  He is no longer on chronic anticoagulation, but continues to take aspirin 81 mg daily.   He denies recurrent leg swelling.  He has a history of hypertension and blood pressures in the past have been relatively well controlled on have combination therapy consisting of irbesartan HCT 300/12.5 mg in addition to metoprolol succinate 50 mg daily.  He denies any leg swelling.  He continues to be active.  When I last saw him over 2 years ago, I discussed with him most recent hypertensive guidelines and suggested the addition of low-dose amlodipine.  Presently, he is no longer taking amlodipine but continues to be on metoprolol succinate 50 mg in addition to irbesartan HCT 300/12.5 mg daily.  His blood pressure today is stable and ECG shows normal sinus rhythm at 64 bpm.  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  I am recommending a 2D echo Doppler study for further evaluation.  He continues to be on Janumet 50/1000 twice a day in addition to metformin for diabetes and levothyroxine 20 mcg for hypothyroidism.  Presently has been taking atorvastatin 40 mg for hyperlipidemia with most recent LDL on January 15, 2023 at 74.  I will contact him regarding his echocardiographic evaluation.  He lives in [place]  area.  I discussed with him my plans for future retirement most likely next year and we will try to arrange for him to have cardiology follow-up in our [place] or [place] office following my retirement.   Nicki Guadalajara, MD, [facility] 03/29/2023  12:27 PM

## 2023-03-29 ENCOUNTER — Encounter: Payer: Self-pay | Admitting: Cardiovascular Disease

## 2023-04-15 ENCOUNTER — Telehealth: Payer: Self-pay | Admitting: Cardiovascular Disease

## 2023-04-15 MED ORDER — IRBESARTAN-HYDROCHLOROTHIAZIDE 300-12.5 MG PO TABS: 1.0000 | ORAL_TABLET | Freq: Every day | ORAL | 3 refills | Status: DC

## 2023-04-15 NOTE — Telephone Encounter (Signed)
*  STAT* If patient is at the pharmacy, call can be transferred to refill team.   1. Which medications need to be refilled? (please list name of each medication and dose if known)   irbesartan-hydrochlorothiazide (AVALIDE) 300-12.5 MG tablet    2. Which pharmacy/location (including street and city if local pharmacy) is medication to be sent to?[id number] - [place], [place] - [address]   3. Do they need a 30 day or 90 day supply? 90 day

## 2023-04-15 NOTE — Telephone Encounter (Signed)
Pt's medication was sent to pt's pharmacy as requested. Confirmation received.  °

## 2023-04-16 DIAGNOSIS — J069 Acute upper respiratory infection, unspecified: Secondary | ICD-10-CM | POA: Diagnosis not present

## 2023-04-16 DIAGNOSIS — Z6825 Body mass index (BMI) 25.0-25.9, adult: Secondary | ICD-10-CM | POA: Diagnosis not present

## 2023-04-16 DIAGNOSIS — Z20828 Contact with and (suspected) exposure to other viral communicable diseases: Secondary | ICD-10-CM | POA: Diagnosis not present

## 2023-04-16 DIAGNOSIS — E663 Overweight: Secondary | ICD-10-CM | POA: Diagnosis not present

## 2023-04-16 DIAGNOSIS — R6889 Other general symptoms and signs: Secondary | ICD-10-CM | POA: Diagnosis not present

## 2023-04-20 ENCOUNTER — Other Ambulatory Visit (HOSPITAL_COMMUNITY): Payer: PPO

## 2023-04-24 ENCOUNTER — Ambulatory Visit (HOSPITAL_COMMUNITY): Payer: PPO

## 2023-05-07 ENCOUNTER — Ambulatory Visit: Payer: PPO | Attending: Cardiovascular Disease

## 2023-05-07 DIAGNOSIS — I10 Essential (primary) hypertension: Secondary | ICD-10-CM | POA: Diagnosis not present

## 2023-05-07 DIAGNOSIS — R011 Cardiac murmur, unspecified: Secondary | ICD-10-CM

## 2023-05-08 LAB — ECHOCARDIOGRAM COMPLETE
AR max vel: 2.16 cm2
AV Area VTI: 1.91 cm2
AV Area mean vel: 2.11 cm2
AV Mean grad: 10 mm[Hg]
AV Peak grad: 17.5 mm[Hg]
Ao pk vel: 2.09 m/s
Area-P 1/2: 3.23 cm2
Calc EF: 58.3 %
MV VTI: 4.12 cm2
P 1/2 time: 320 ms
S' Lateral: 3.6 cm
Single Plane A2C EF: 56.4 %
Single Plane A4C EF: 54.9 %

## 2023-06-02 DIAGNOSIS — E1129 Type 2 diabetes mellitus with other diabetic kidney complication: Secondary | ICD-10-CM | POA: Diagnosis not present

## 2023-06-02 DIAGNOSIS — I10 Essential (primary) hypertension: Secondary | ICD-10-CM | POA: Diagnosis not present

## 2023-06-02 DIAGNOSIS — E782 Mixed hyperlipidemia: Secondary | ICD-10-CM | POA: Diagnosis not present

## 2023-06-23 DIAGNOSIS — Z1331 Encounter for screening for depression: Secondary | ICD-10-CM | POA: Diagnosis not present

## 2023-06-23 DIAGNOSIS — E119 Type 2 diabetes mellitus without complications: Secondary | ICD-10-CM | POA: Diagnosis not present

## 2023-06-23 DIAGNOSIS — Z0001 Encounter for general adult medical examination with abnormal findings: Secondary | ICD-10-CM | POA: Diagnosis not present

## 2023-06-23 DIAGNOSIS — E663 Overweight: Secondary | ICD-10-CM | POA: Diagnosis not present

## 2023-06-23 DIAGNOSIS — I10 Essential (primary) hypertension: Secondary | ICD-10-CM | POA: Diagnosis not present

## 2023-06-23 DIAGNOSIS — Z6825 Body mass index (BMI) 25.0-25.9, adult: Secondary | ICD-10-CM | POA: Diagnosis not present

## 2023-06-23 DIAGNOSIS — E1159 Type 2 diabetes mellitus with other circulatory complications: Secondary | ICD-10-CM | POA: Diagnosis not present

## 2023-06-23 DIAGNOSIS — E785 Hyperlipidemia, unspecified: Secondary | ICD-10-CM | POA: Diagnosis not present

## 2023-06-23 DIAGNOSIS — E039 Hypothyroidism, unspecified: Secondary | ICD-10-CM | POA: Diagnosis not present

## 2023-07-27 DIAGNOSIS — J069 Acute upper respiratory infection, unspecified: Secondary | ICD-10-CM | POA: Diagnosis not present

## 2023-07-27 DIAGNOSIS — Z6825 Body mass index (BMI) 25.0-25.9, adult: Secondary | ICD-10-CM | POA: Diagnosis not present

## 2023-07-27 DIAGNOSIS — Z20828 Contact with and (suspected) exposure to other viral communicable diseases: Secondary | ICD-10-CM | POA: Diagnosis not present

## 2023-08-10 NOTE — Progress Notes (Signed)
 History of Present Illness:  Jordan Goodwin returns for continued follow up of the below issues:  ELEVATED PSA  He initially presented in 2011 with a PSA of 5.25. This decreased to below 3 shortly after his first visit. It was felt that he had a UTI. His PSA has been normal since that time.      RENAL  CYST:    Right renal cyst seen on recent CT scan. CT scan was performed for left-sided back pain with hematuria.   He has had no recent pain or hematuria.   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 No ureteral stones. Pain was not crampy in nature. Not associated with nausea and vomiting. No associated gross hematuria.  MICROSCOPIC HEMATURIA: Benign evaluation in the past.     3.11.2025: Here today for routine check.  He has had no real urologic issues over the past year.  He has had no blood in his urine.  No stone episodes.  Past Medical History:  Diagnosis Date   Complication of anesthesia    As a child, woke up "yelling."   Diabetes mellitus    DVT (deep venous thrombosis) (HCC) 08/2009   left lower extremity and had a DVT inthe left posterior tibial vein   Hypertension    Mixed hyperlipidemia    low HDL   Murmur    SOB (shortness of breath) 08/01/2010   2D Echo EF >55%    Past Surgical History:  Procedure Laterality Date   CATARACT EXTRACTION, BILATERAL     COLONOSCOPY N/A 03/19/2015   Procedure: COLONOSCOPY;  Surgeon: Corbin Ade, MD;  Location: [facility];  Service: Endoscopy;  Laterality: N/A;  9:30 AM   KNEE ARTHROSCOPY  2012   left knee/torn meniscus   LEG SURGERY  2012   LLE/compartment syndrome after blood clot   RETINAL DETACHMENT SURGERY     x's 2   TONSILLECTOMY  1962    Home Medications:  Allergies as of 08/11/2023       Reactions   Coumadin [warfarin]    Warfarin And Related    Compartment syndrome        Medication List        Accurate  as of August 10, 2023  9:59 AM. If you have any questions, ask your nurse or doctor.          aspirin EC 81 MG tablet Take 81 mg by mouth daily. Swallow whole.   atorvastatin 40 MG tablet Commonly known as: LIPITOR Take 1 tablet (40 mg total) by mouth daily. Please call office to schedule an appt for further refills. Thank you   brimonidine-timolol 0.2-0.5 % ophthalmic solution Commonly known as: Combigan Place 1 drop into both eyes 2 (two) times daily.   cyclopentolate 1 % ophthalmic solution Commonly known as: CYCLODRYL,CYCLOGYL Place 1 drop into the left eye at bedtime.   glucosamine-chondroitin 500-400 MG tablet Take 1 tablet by mouth 2 (two) times daily.   irbesartan-hydrochlorothiazide 300-12.5 MG tablet Commonly known as: AVALIDE Take 1 tablet by mouth daily.   IRON 27 PO Take by mouth.   Janumet 50-1000 MG tablet Generic drug: sitaGLIPtin-metformin Take 1 tablet by mouth 2 (two) times daily.   levothyroxine 200 MCG tablet Commonly known as: SYNTHROID Take 200 mcg by mouth daily.   metFORMIN 500 MG tablet Commonly known as: GLUCOPHAGE Take 1 tablet by mouth daily.   metoprolol succinate 50 MG 24  hr tablet Commonly known as: TOPROL-XL Take 1 tablet (50 mg total) by mouth daily. Patient must make appointment for future refills first attempt        Allergies:  Allergies  Allergen Reactions   Coumadin [Warfarin]    Warfarin And Related     Compartment syndrome    Family History  Problem Relation Age of Onset   Cancer Father 74       deceased   Heart disease Maternal Grandmother    Heart disease Paternal Grandfather     Social History:  reports that he has never smoked. He has never used smokeless tobacco. He reports current alcohol use. He reports that he does not use drugs.  ROS: A complete review of systems was performed.  All systems are negative except for pertinent findings as noted.  Physical Exam:  Vital signs in last 24 hours: There  were no vitals taken for this visit. Constitutional:  Alert and oriented, No acute distress Cardiovascular: Regular rate  Respiratory: Normal respiratory effort GI: There are no inguinal hernias. Genitourinary: Normal uncircumcised male phallus, testes are descended bilaterally and non-tender and without masses, scrotum is normal in appearance without lesions or masses, perineum is normal on inspection.  Normal anal sphincter tone.  Prostate is 60 g, symmetric, nonnodular nontender., . Neurologic: Grossly intact, no focal deficits Psychiatric: Normal mood and affect  I have reviewed prior pt notes  I have reviewed urinalysis results--persistent microscopic hematuria.  I have independently reviewed prior imaging-CT scan from 2023.  Images were viewed with the patient today.  I have reviewed prior PSA results--last PSA from a year ago 1.9.  I have reviewed IPSS sheet   Impression/Assessment:  1.  BPH with benign exam, minimal symptoms  2.  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  His PSA was checked today  2.  At his request, I will continue annual follow-up here

## 2023-08-11 ENCOUNTER — Encounter: Payer: Self-pay | Admitting: Urology

## 2023-08-11 ENCOUNTER — Ambulatory Visit: Payer: PPO | Admitting: Urology

## 2023-08-11 VITALS — BP 155/75 | HR 64

## 2023-08-11 DIAGNOSIS — Q632 Ectopic kidney: Secondary | ICD-10-CM

## 2023-08-11 DIAGNOSIS — N401 Enlarged prostate with lower urinary tract symptoms: Secondary | ICD-10-CM | POA: Diagnosis not present

## 2023-08-11 DIAGNOSIS — R972 Elevated prostate specific antigen [PSA]: Secondary | ICD-10-CM

## 2023-08-11 DIAGNOSIS — N200 Calculus of kidney: Secondary | ICD-10-CM

## 2023-08-11 DIAGNOSIS — N400 Enlarged prostate without lower urinary tract symptoms: Secondary | ICD-10-CM

## 2023-08-11 LAB — URINALYSIS, ROUTINE W REFLEX MICROSCOPIC
Bilirubin, UA: NEGATIVE
Glucose, UA: NEGATIVE
Ketones, UA: NEGATIVE
Leukocytes,UA: NEGATIVE
Nitrite, UA: NEGATIVE
Protein,UA: NEGATIVE
Specific Gravity, UA: 1.03 (ref 1.005–1.030)
Urobilinogen, Ur: 0.2 mg/dL (ref 0.2–1.0)
pH, UA: 5.5 (ref 5.0–7.5)

## 2023-08-11 LAB — MICROSCOPIC EXAMINATION: Bacteria, UA: NONE SEEN

## 2023-08-12 LAB — PSA: Prostate Specific Ag, Serum: 8.1 ng/mL — ABNORMAL HIGH (ref 0.0–4.0)

## 2023-08-17 ENCOUNTER — Other Ambulatory Visit: Payer: Self-pay | Admitting: Urology

## 2023-08-17 ENCOUNTER — Telehealth: Payer: Self-pay

## 2023-08-17 DIAGNOSIS — R972 Elevated prostate specific antigen [PSA]: Secondary | ICD-10-CM

## 2023-08-17 NOTE — Telephone Encounter (Signed)
 Called Pt to relay message per last telephone encounter we scheduled a lab visit appointment and will reschedule an OV based on results provided by MD

## 2023-08-17 NOTE — Telephone Encounter (Signed)
-----   Message from Bertram Millard Dahlstedt sent at 08/17/2023 12:09 PM EDT ----- Please call patient-his PSA went from about the 2 range to 8 when  checked last week.  This is unusual for him, and I think that we should recheck in 6 weeks.  I put an order in for that. ----- Message ----- From: Interface, [facility] Lab Results In Sent: 08/11/2023   3:35 PM EDT To: Marcine Matar, MD

## 2023-08-24 DIAGNOSIS — H4020X2 Unspecified primary angle-closure glaucoma, moderate stage: Secondary | ICD-10-CM | POA: Diagnosis not present

## 2023-08-24 DIAGNOSIS — H4042X3 Glaucoma secondary to eye inflammation, left eye, severe stage: Secondary | ICD-10-CM | POA: Diagnosis not present

## 2023-08-24 DIAGNOSIS — H43811 Vitreous degeneration, right eye: Secondary | ICD-10-CM | POA: Diagnosis not present

## 2023-08-24 DIAGNOSIS — E119 Type 2 diabetes mellitus without complications: Secondary | ICD-10-CM | POA: Diagnosis not present

## 2023-08-24 DIAGNOSIS — Z9889 Other specified postprocedural states: Secondary | ICD-10-CM | POA: Diagnosis not present

## 2023-08-28 ENCOUNTER — Ambulatory Visit (HOSPITAL_COMMUNITY)
Admission: RE | Admit: 2023-08-28 | Discharge: 2023-08-28 | Disposition: A | Discharge status: Home / Self Care | Source: Ambulatory Visit | Attending: Family Medicine | Admitting: Family Medicine

## 2023-08-28 ENCOUNTER — Other Ambulatory Visit (HOSPITAL_COMMUNITY): Payer: Self-pay | Admitting: Family Medicine

## 2023-08-28 DIAGNOSIS — J069 Acute upper respiratory infection, unspecified: Secondary | ICD-10-CM | POA: Insufficient documentation

## 2023-08-28 DIAGNOSIS — R052 Subacute cough: Secondary | ICD-10-CM | POA: Diagnosis not present

## 2023-08-28 DIAGNOSIS — Z6825 Body mass index (BMI) 25.0-25.9, adult: Secondary | ICD-10-CM | POA: Diagnosis not present

## 2023-09-29 ENCOUNTER — Other Ambulatory Visit

## 2023-09-29 ENCOUNTER — Ambulatory Visit: Admitting: Urology

## 2023-09-29 DIAGNOSIS — R972 Elevated prostate specific antigen [PSA]: Secondary | ICD-10-CM | POA: Diagnosis not present

## 2023-09-30 LAB — PSA: Prostate Specific Ag, Serum: 5.6 ng/mL — ABNORMAL HIGH (ref 0.0–4.0)

## 2023-10-05 NOTE — Progress Notes (Signed)
 History of Present Illness:  Jacqueline returns for continued follow up of the below issues:  ELEVATED PSA  He initially presented in 2011 with a PSA of 5.25. This decreased to below 3 shortly after his first visit. It was felt that he had a UTI. His PSA has been normal since that time.      RENAL  CYST:    Right renal cyst seen on recent CT scan. CT scan was performed for left-sided back pain with hematuria.   He has had no recent pain or hematuria.   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 No ureteral stones. Pain was not crampy in nature. Not associated with nausea and vomiting. No associated gross hematuria.  MICROSCOPIC HEMATURIA: Benign evaluation in the past.     3.11.2025: Here today for routine check.  He has had no real urologic issues over the past year.  He has had no blood in his urine.  No stone episodes. PSA 8.1  5.6.2025: Returns following PSA recheck. PSA last week down to 5.6.  He is here today just for results.  No recent change in urine pattern  Past Medical History:  Diagnosis Date   Complication of anesthesia    As a child, woke up "yelling."   Diabetes mellitus    DVT (deep venous thrombosis) (HCC) 08/2009   left lower extremity and had a DVT inthe left posterior tibial vein   Hypertension    Mixed hyperlipidemia    low HDL   Murmur    SOB (shortness of breath) 08/01/2010   2D Echo EF >55%    Past Surgical History:  Procedure Laterality Date   CATARACT EXTRACTION, BILATERAL     COLONOSCOPY N/A 03/19/2015   Procedure: COLONOSCOPY;  Surgeon: Suzette Espy, MD;  Location: [facility];  Service: Endoscopy;  Laterality: N/A;  9:30 AM   KNEE ARTHROSCOPY  2012   left knee/torn meniscus   LEG SURGERY  2012   LLE/compartment syndrome after blood clot   RETINAL DETACHMENT SURGERY     x's 2   TONSILLECTOMY  1962    Home Medications:  Allergies as of  10/06/2023       Reactions   Coumadin [warfarin]    Warfarin And Related    Compartment syndrome        Medication List        Accurate as of Oct 05, 2023  7:00 PM. If you have any questions, ask your nurse or doctor.          aspirin EC 81 MG tablet Take 81 mg by mouth daily. Swallow whole.   atorvastatin  40 MG tablet Commonly known as: LIPITOR Take 1 tablet (40 mg total) by mouth daily. Please call office to schedule an appt for further refills. Thank you   brimonidine -timolol  0.2-0.5 % ophthalmic solution Commonly known as: Combigan  Place 1 drop into both eyes 2 (two) times daily.   cyclopentolate  1 % ophthalmic solution Commonly known as: CYCLODRYL,CYCLOGYL  Place 1 drop into the left eye at bedtime.   glucosamine-chondroitin 500-400 MG tablet Take 1 tablet by mouth 2 (two) times daily.   irbesartan -hydrochlorothiazide  300-12.5 MG tablet Commonly known as: AVALIDE Take 1 tablet by mouth daily.   IRON 27 PO Take by mouth.   Janumet 50-1000 MG tablet Generic drug: sitaGLIPtin-metformin Take 1 tablet by mouth 2 (two) times daily.   levothyroxine  200 MCG tablet Commonly known as: SYNTHROID   Take 200 mcg by mouth daily.   metFORMIN 500 MG tablet Commonly known as: GLUCOPHAGE Take 1 tablet by mouth daily.   metoprolol  succinate 50 MG 24 hr tablet Commonly known as: TOPROL -XL Take 1 tablet (50 mg total) by mouth daily. Patient must make appointment for future refills first attempt        Allergies:  Allergies  Allergen Reactions   Coumadin [Warfarin]    Warfarin And Related     Compartment syndrome    Family History  Problem Relation Age of Onset   Cancer Father 42       deceased   Heart disease Maternal Grandmother    Heart disease Paternal Grandfather     Social History:  reports that he has never smoked. He has never used smokeless tobacco. He reports current alcohol use. He reports that he does not use drugs.  ROS: A complete review of  systems was performed.  All systems are negative except for pertinent findings as noted.  Physical Exam:  Vital signs in last 24 hours: There were no vitals taken for this visit. Constitutional:  Alert and oriented, No acute distress Cardiovascular: Regular rate  Respiratory: Normal respiratory effort Neurologic: Grossly intact, no focal deficits Psychiatric: Normal mood and affect  I have reviewed prior pt notes  I have reviewed urinalysis results--persistent microscopic hematuria.  I have independently reviewed prior imaging-CT scan from 2023.  Images were viewed with the patient today.  I have reviewed prior PSA results--PSA from March 8 0.1, recently 5.6  I have reviewed IPSS sheet--5/1   Impression/Assessment:  1.  BPH with benign exam, minimal symptoms  2.  History of microscopic hematuria with negative evaluation  3.  Pelvic kidney with asymptomatic calculus disease  4.  Recent bump in PSA, most likely related to inflammatory process  Plan: 1.  Reassurance regarding PSA data curve  2.  I will have him drop in for PSA only in 3 months, continue March, 2026 appointment

## 2023-10-06 ENCOUNTER — Ambulatory Visit: Admitting: Urology

## 2023-10-06 VITALS — BP 169/64 | HR 66

## 2023-10-06 DIAGNOSIS — Z87898 Personal history of other specified conditions: Secondary | ICD-10-CM | POA: Diagnosis not present

## 2023-10-06 DIAGNOSIS — N400 Enlarged prostate without lower urinary tract symptoms: Secondary | ICD-10-CM | POA: Diagnosis not present

## 2023-10-06 DIAGNOSIS — R972 Elevated prostate specific antigen [PSA]: Secondary | ICD-10-CM | POA: Diagnosis not present

## 2023-10-06 LAB — URINALYSIS, ROUTINE W REFLEX MICROSCOPIC
Bilirubin, UA: NEGATIVE
Glucose, UA: NEGATIVE
Ketones, UA: NEGATIVE
Leukocytes,UA: NEGATIVE
Nitrite, UA: NEGATIVE
Protein,UA: NEGATIVE
Specific Gravity, UA: 1.025 (ref 1.005–1.030)
Urobilinogen, Ur: 0.2 mg/dL (ref 0.2–1.0)
pH, UA: 6 (ref 5.0–7.5)

## 2023-10-06 LAB — MICROSCOPIC EXAMINATION
Bacteria, UA: NONE SEEN
WBC, UA: NONE SEEN /HPF (ref 0–5)

## 2023-10-14 DIAGNOSIS — M79605 Pain in left leg: Secondary | ICD-10-CM | POA: Diagnosis not present

## 2023-10-14 DIAGNOSIS — S8012XA Contusion of left lower leg, initial encounter: Secondary | ICD-10-CM | POA: Diagnosis not present

## 2023-12-03 DIAGNOSIS — M25551 Pain in right hip: Secondary | ICD-10-CM | POA: Diagnosis not present

## 2023-12-15 ENCOUNTER — Ambulatory Visit (HOSPITAL_COMMUNITY)
Admission: RE | Admit: 2023-12-15 | Discharge: 2023-12-15 | Disposition: A | Discharge status: Home / Self Care | Source: Ambulatory Visit | Attending: Orthopedic Surgery | Admitting: Orthopedic Surgery

## 2023-12-15 ENCOUNTER — Other Ambulatory Visit (HOSPITAL_COMMUNITY): Payer: Self-pay | Admitting: Orthopedic Surgery

## 2023-12-15 DIAGNOSIS — M4726 Other spondylosis with radiculopathy, lumbar region: Secondary | ICD-10-CM | POA: Diagnosis not present

## 2023-12-15 DIAGNOSIS — M4316 Spondylolisthesis, lumbar region: Secondary | ICD-10-CM | POA: Diagnosis not present

## 2023-12-15 DIAGNOSIS — M5459 Other low back pain: Secondary | ICD-10-CM | POA: Diagnosis not present

## 2023-12-15 DIAGNOSIS — M545 Low back pain, unspecified: Secondary | ICD-10-CM | POA: Diagnosis not present

## 2023-12-15 DIAGNOSIS — M48061 Spinal stenosis, lumbar region without neurogenic claudication: Secondary | ICD-10-CM | POA: Diagnosis not present

## 2023-12-15 DIAGNOSIS — M25551 Pain in right hip: Secondary | ICD-10-CM | POA: Diagnosis not present

## 2024-01-06 ENCOUNTER — Telehealth: Payer: Self-pay | Admitting: Urology

## 2024-01-06 ENCOUNTER — Other Ambulatory Visit

## 2024-01-06 DIAGNOSIS — R972 Elevated prostate specific antigen [PSA]: Secondary | ICD-10-CM

## 2024-01-06 NOTE — Telephone Encounter (Signed)
 Called pt to let him know per MD Dahlstedt the new rx that pt is now taking should not affect his PSA pt voiced his understanding stating he just wanted to make sure because a previous medication had affected his PSA before

## 2024-01-06 NOTE — Telephone Encounter (Signed)
 Patient wants Dr D to know he has been having sciatic pain and has been on medication and he is not sure if that will affect his PSA. He states it has affected his result before and wants him to be aware.

## 2024-01-06 NOTE — Telephone Encounter (Signed)
 Called pt to notify him a message with his concerns was sent to MD Dahlstedt and once MD Dahlstedt gives us  and update he would be notified pt voiced his understanding

## 2024-01-07 DIAGNOSIS — M545 Low back pain, unspecified: Secondary | ICD-10-CM | POA: Diagnosis not present

## 2024-01-07 LAB — PSA: Prostate Specific Ag, Serum: 7.8 ng/mL — ABNORMAL HIGH (ref 0.0–4.0)

## 2024-01-08 ENCOUNTER — Other Ambulatory Visit: Payer: Self-pay

## 2024-01-12 ENCOUNTER — Telehealth: Payer: Self-pay | Admitting: Cardiovascular Disease

## 2024-01-12 ENCOUNTER — Ambulatory Visit: Payer: Self-pay | Admitting: Urology

## 2024-01-12 NOTE — Telephone Encounter (Signed)
 *  STAT* If patient is at the pharmacy, call can be transferred to refill team.   1. Which medications need to be refilled? (please list name of each medication and dose if known)   irbesartan -hydrochlorothiazide  (AVALIDE) 300-12.5 MG tablet   2. Which pharmacy/location (including street and city if local pharmacy) is medication to be sent to?  [id number] - [place], [place] - [address]   3. Do they need a 30 day or 90 day supply?  90

## 2024-01-12 NOTE — Telephone Encounter (Signed)
 RX sent in a year supply in 04/2023

## 2024-01-13 ENCOUNTER — Other Ambulatory Visit: Payer: Self-pay

## 2024-01-13 DIAGNOSIS — M545 Low back pain, unspecified: Secondary | ICD-10-CM | POA: Diagnosis not present

## 2024-01-13 MED ORDER — IRBESARTAN-HYDROCHLOROTHIAZIDE 300-12.5 MG PO TABS: 1.0000 | ORAL_TABLET | Freq: Every day | ORAL | 0 refills | Status: AC

## 2024-01-18 DIAGNOSIS — M5459 Other low back pain: Secondary | ICD-10-CM | POA: Diagnosis not present

## 2024-01-20 DIAGNOSIS — M545 Low back pain, unspecified: Secondary | ICD-10-CM | POA: Diagnosis not present

## 2024-01-21 ENCOUNTER — Telehealth: Payer: Self-pay | Admitting: Urology

## 2024-01-21 NOTE — Telephone Encounter (Signed)
 Patient called he is trying to schedule the MRI before his next appointment , but there is no order in the system?

## 2024-01-22 ENCOUNTER — Telehealth: Payer: Self-pay | Admitting: Urology

## 2024-01-22 DIAGNOSIS — E785 Hyperlipidemia, unspecified: Secondary | ICD-10-CM | POA: Diagnosis not present

## 2024-01-22 DIAGNOSIS — N400 Enlarged prostate without lower urinary tract symptoms: Secondary | ICD-10-CM | POA: Diagnosis not present

## 2024-01-22 DIAGNOSIS — G4733 Obstructive sleep apnea (adult) (pediatric): Secondary | ICD-10-CM | POA: Diagnosis not present

## 2024-01-22 DIAGNOSIS — E039 Hypothyroidism, unspecified: Secondary | ICD-10-CM | POA: Diagnosis not present

## 2024-01-22 DIAGNOSIS — E1159 Type 2 diabetes mellitus with other circulatory complications: Secondary | ICD-10-CM | POA: Diagnosis not present

## 2024-01-22 DIAGNOSIS — I10 Essential (primary) hypertension: Secondary | ICD-10-CM | POA: Diagnosis not present

## 2024-01-22 DIAGNOSIS — E782 Mixed hyperlipidemia: Secondary | ICD-10-CM | POA: Diagnosis not present

## 2024-01-22 DIAGNOSIS — M545 Low back pain, unspecified: Secondary | ICD-10-CM | POA: Diagnosis not present

## 2024-01-22 DIAGNOSIS — Z6824 Body mass index (BMI) 24.0-24.9, adult: Secondary | ICD-10-CM | POA: Diagnosis not present

## 2024-01-22 NOTE — Telephone Encounter (Signed)
Needs MRI order

## 2024-01-22 NOTE — Telephone Encounter (Signed)
 We are waiting for MD to confirm this order.  Please inform patient provider is on vacation this week.  We will reach out next week order pending MD response.

## 2024-01-25 ENCOUNTER — Other Ambulatory Visit: Payer: Self-pay | Admitting: Urology

## 2024-01-25 DIAGNOSIS — R972 Elevated prostate specific antigen [PSA]: Secondary | ICD-10-CM

## 2024-01-25 NOTE — Telephone Encounter (Signed)
 Called pt to ask if [facility] scheduled MRI pt stated he had not but would perfer to have the MRI scheduled at 

## 2024-01-25 NOTE — Telephone Encounter (Signed)
-----   Message from Garnette HERO Dahlstedt sent at 01/25/2024  1:51 PM EDT ----- He does not need a prostate biopsy, I put an order in for an MRI. ----- Message ----- From: Sammie Exie HERO, CMA Sent: 01/18/2024   8:28 AM EDT To: Garnette Shack, MD  Dr. Shack just to confirm you want me to see if pt wants a prostate bx ? ----- Message ----- From: Shack Garnette, MD Sent: 01/15/2024   4:22 PM EDT To: Veleria JONELLE Silversmith, CMA; Exie HERO Jachin Coury,#  I accidentally pasted a note to another pt to Mr Kyte/Chelsea in [place] that she would call to sched an MRI. I sent a previous not to Mr Demma telling him that I thought that he should have  one (which is what I think he should have done). Please call him and ask if he would like to proceed w/ an MRI to evaluate his elevated PSA ----- Message ----- From: Interface, [facility] Lab Results In Sent: 01/07/2024   5:37 AM EDT To: Garnette Shack, MD

## 2024-01-27 ENCOUNTER — Ambulatory Visit (HOSPITAL_COMMUNITY)
Admission: RE | Admit: 2024-01-27 | Discharge: 2024-01-27 | Disposition: A | Discharge status: Home / Self Care | Source: Ambulatory Visit | Attending: Urology | Admitting: Urology

## 2024-01-27 DIAGNOSIS — N4289 Other specified disorders of prostate: Secondary | ICD-10-CM | POA: Diagnosis not present

## 2024-01-27 DIAGNOSIS — M545 Low back pain, unspecified: Secondary | ICD-10-CM | POA: Diagnosis not present

## 2024-01-27 DIAGNOSIS — R972 Elevated prostate specific antigen [PSA]: Secondary | ICD-10-CM | POA: Diagnosis not present

## 2024-01-27 MED ORDER — GADOBUTROL 1 MMOL/ML IV SOLN
10.0000 mL | Freq: Once | INTRAVENOUS | Status: AC | PRN
  Administered 2024-01-27: 10 mL via INTRAVENOUS

## 2024-01-29 DIAGNOSIS — M545 Low back pain, unspecified: Secondary | ICD-10-CM | POA: Diagnosis not present

## 2024-02-05 DIAGNOSIS — M545 Low back pain, unspecified: Secondary | ICD-10-CM | POA: Diagnosis not present

## 2024-02-10 DIAGNOSIS — M545 Low back pain, unspecified: Secondary | ICD-10-CM | POA: Diagnosis not present

## 2024-02-16 DIAGNOSIS — M545 Low back pain, unspecified: Secondary | ICD-10-CM | POA: Diagnosis not present

## 2024-02-17 ENCOUNTER — Telehealth: Payer: Self-pay | Admitting: Urology

## 2024-02-17 NOTE — Telephone Encounter (Signed)
 Looks like this might be a [place] pt. Can you help him?

## 2024-02-17 NOTE — Telephone Encounter (Signed)
 Pt called and stated Dr Lanice wants him to have a biopsy and for him to call and schedule it. I don't see any phone encounters, no orders and no messages. Please advise on what needs to be done.

## 2024-02-18 NOTE — Telephone Encounter (Signed)
 Looks like pt had a prostate MRI.  Dr. Matilda sometimes calls with results.  Do you have any results or orders for pt to get a prostate bx?  Please reach out to MD to confirm and update pt when you can.  Thanks

## 2024-02-18 NOTE — Telephone Encounter (Signed)
 Just to clarify, I just need to get him set up with a referral for a fusion biopsy?

## 2024-02-18 NOTE — Telephone Encounter (Signed)
 I think a referral needs to be placed for a fusion bx.

## 2024-02-19 ENCOUNTER — Telehealth: Payer: Self-pay | Admitting: Urology

## 2024-02-19 DIAGNOSIS — R972 Elevated prostate specific antigen [PSA]: Secondary | ICD-10-CM

## 2024-02-19 NOTE — Telephone Encounter (Signed)
 Patient called into the office today with general questions/concerns regarding MRI fusion biopsy. He would like to go ahead with biopsy at [facility]. Patient may be reached at [phone] to discuss questions.

## 2024-02-19 NOTE — Telephone Encounter (Addendum)
 Pt question/concern was addressed and pt given number to [facility] to scheduled MRI fusion biopsy.  Pt was also made aware to contact [facility] to scheduled before upcoming  f/u appt.  Verbalized understanding

## 2024-02-22 ENCOUNTER — Telehealth: Payer: Self-pay

## 2024-02-22 NOTE — Telephone Encounter (Signed)
 Patient needing referral to [facility].  Please advise.  Call:  [phone]

## 2024-02-22 NOTE — Addendum Note (Signed)
 Addended by: SAMMIE EXIE HERO on: 02/22/2024 11:20 AM   Modules accepted: Orders

## 2024-02-22 NOTE — Telephone Encounter (Signed)
 Pt is made aware referral placed to [facility]. Verbalized understanding

## 2024-02-23 ENCOUNTER — Telehealth: Payer: Self-pay | Admitting: Urology

## 2024-02-23 NOTE — Telephone Encounter (Signed)
 Pt voiced his concerns with needing a fusion bx procedure, pt stated that he has not heard anything and wanted to know what was taking so long, and if he has cancer was all this waiting going to make things worse. Pt advised the referral is in however it has to be approved by insurance first for an appt to be set pt also advised a surgical clearance for his Asprin is needed and will be sent to his PCP for approval pt also stated he has a slight curve in his erection and wanted to know if that has anything to do with his elevated PSA

## 2024-02-23 NOTE — Telephone Encounter (Signed)
 Patient called into the office today with general questions/concerns regarding procedures Patient may be reached at [phone] to discuss questions.  Patient came in office to speak to Dr D. He waited for the nurse to speak with him

## 2024-03-08 ENCOUNTER — Ambulatory Visit: Admitting: Urology

## 2024-03-08 VITALS — BP 169/69 | HR 65

## 2024-03-08 DIAGNOSIS — R972 Elevated prostate specific antigen [PSA]: Secondary | ICD-10-CM | POA: Diagnosis not present

## 2024-03-08 NOTE — Telephone Encounter (Signed)
 Patient walked in and said [facility] told him they need more information to do prostate biopsy. He states he is having a hard time getting them scheduled. Please call [phone]

## 2024-03-08 NOTE — Progress Notes (Signed)
 This man dropped an unannounced today.  He is to be scheduled for a fusion biopsy with [facility].  He is on aspirin and has not heard back about scheduling because he needs cardiology clearance to come off of the aspirin before proceeding with the fusion biopsy. I explained to the patient that we do need cardiology clearance first.  I sent a message to the cardiology PA listed for his next visit to assess this question about coming off the aspirin.

## 2024-03-18 IMAGING — CT CT ABD-PEL WO/W CM
3 of 15 series · 11 of 46 positions shown, 17 images · IV contrast (Omnipaque or Isovue)
Comparison: CT March 16, 2018 and chest CT September 21, 2009

CLINICAL DATA: One month of microscopic hematuria

EXAM:
CT ABDOMEN AND PELVIS WITHOUT AND WITH CONTRAST
TECHNIQUE: Multidetector CT imaging of the abdomen and pelvis was performed
following the standard protocol before and following the bolus
administration of intravenous contrast.

[Series 5: coronal pre · coronal · non-contrast · 0.87mm/px · 2 of 103 slices shown, 3 images]
[im 35/103  soft-tissue]
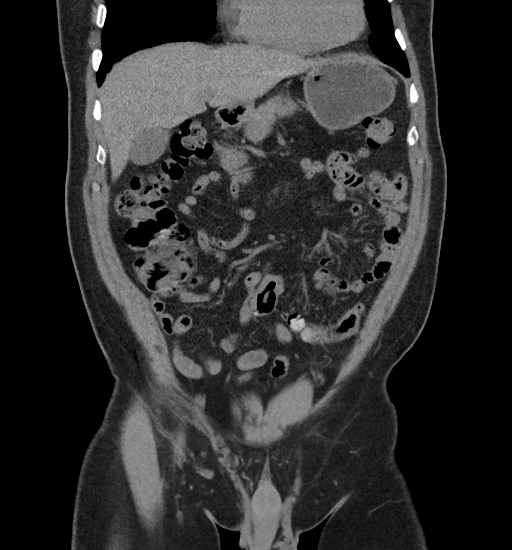
[im 35/103  bone]
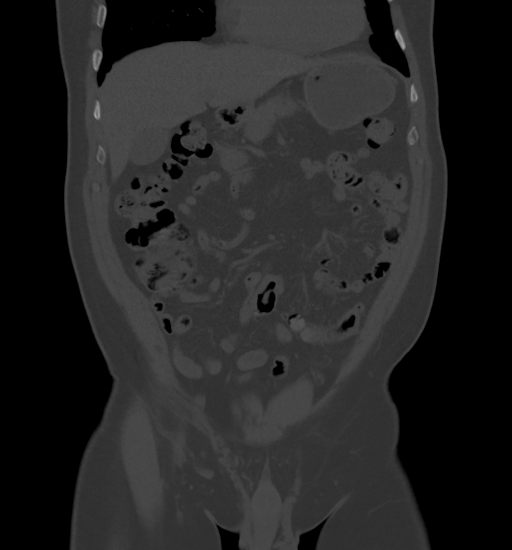
[im 69/103  soft-tissue]
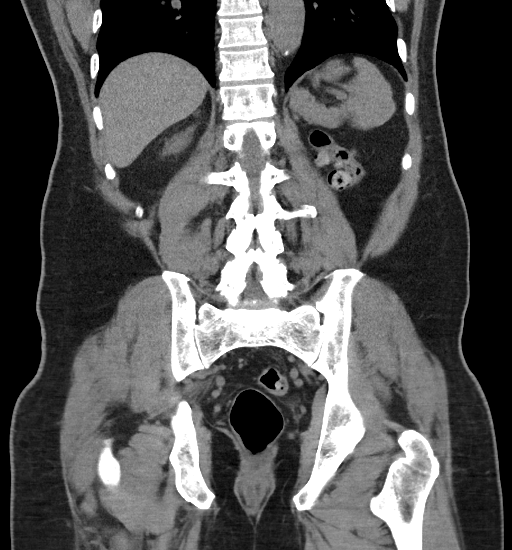

[Series 8: thins post · axial · 0.86mm/px · z∈[+852,+1211]mm · 7 of 958 slices shown, 12 images (1 of 2)]
[im 120/958  soft-tissue]
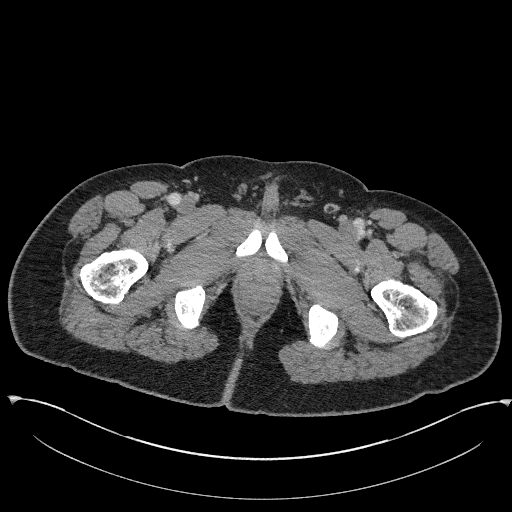
[im 120/958  bone]
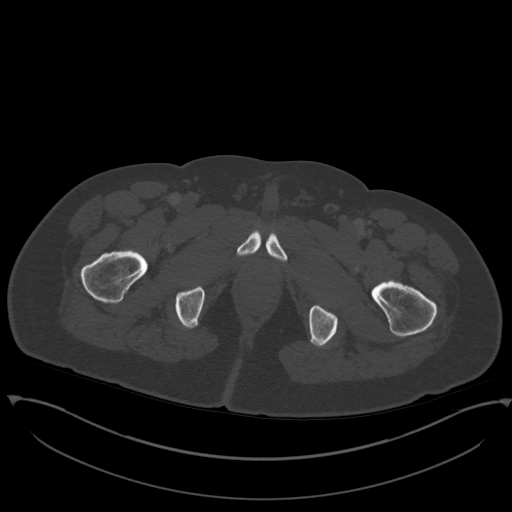
[im 240/958  soft-tissue]
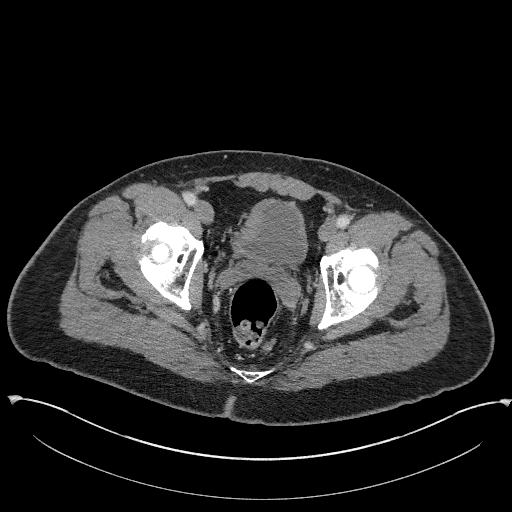
[im 359/958  soft-tissue]
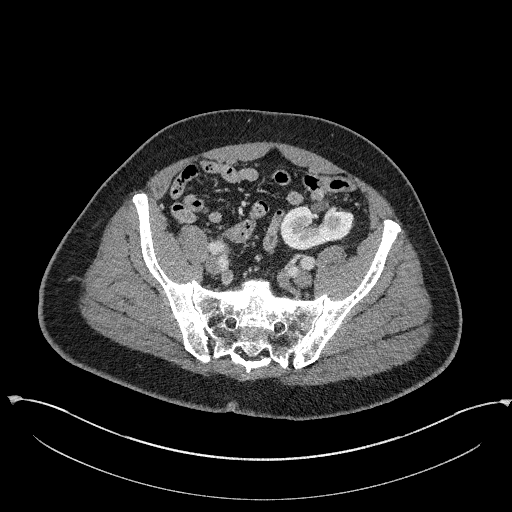
[im 479/958  soft-tissue]
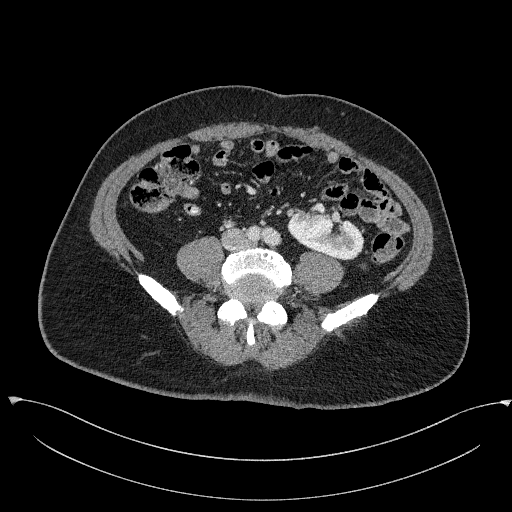
[im 479/958  lung]
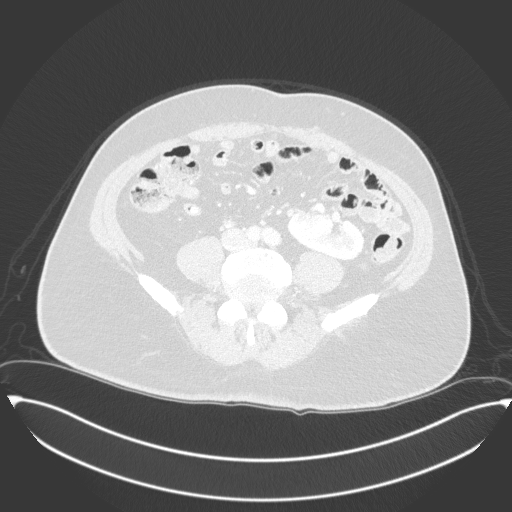
[im 599/958  soft-tissue]
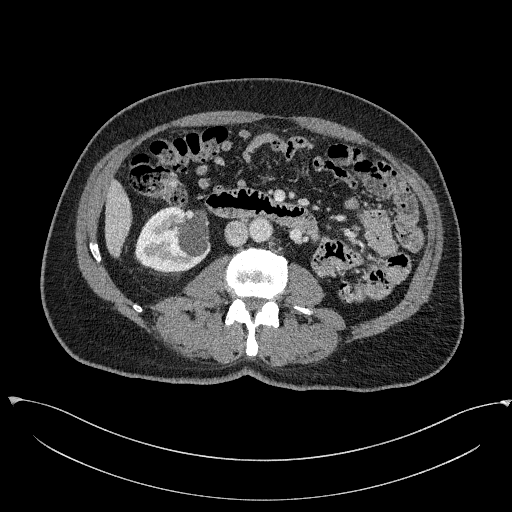
[im 599/958  lung]
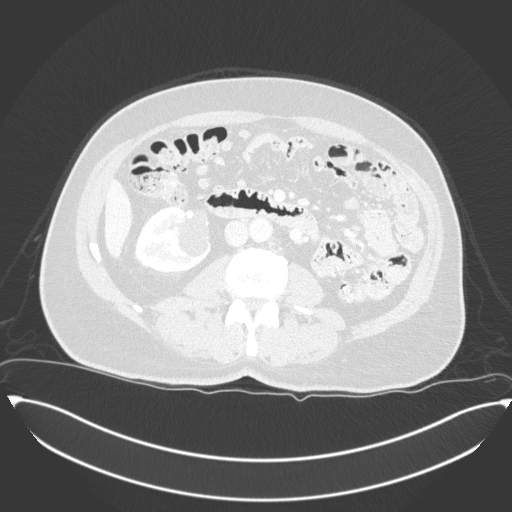
[im 718/958  soft-tissue]
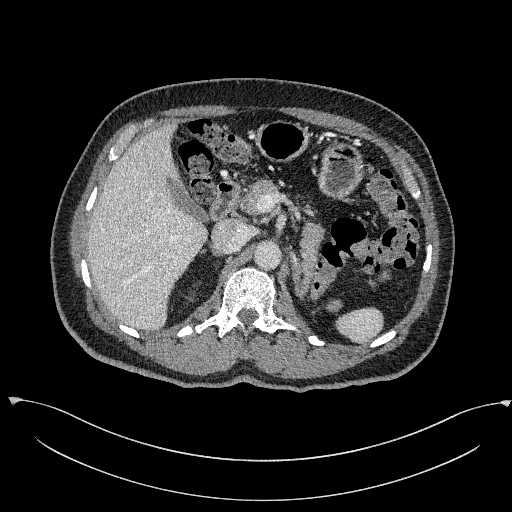
[im 718/958  lung]
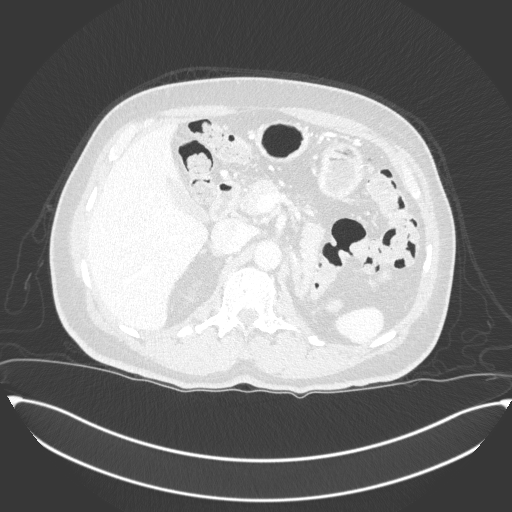
[im 838/958  soft-tissue]
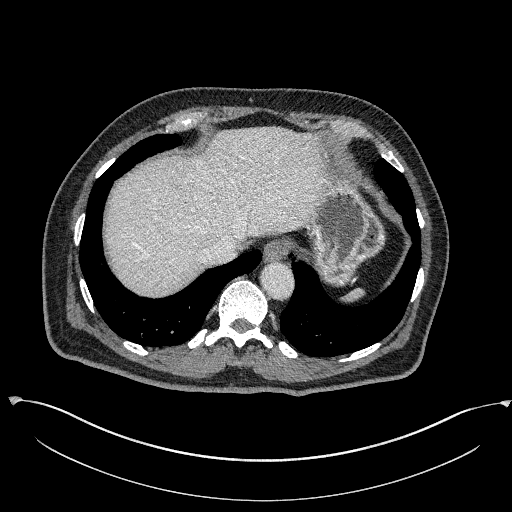
[im 838/958  lung]
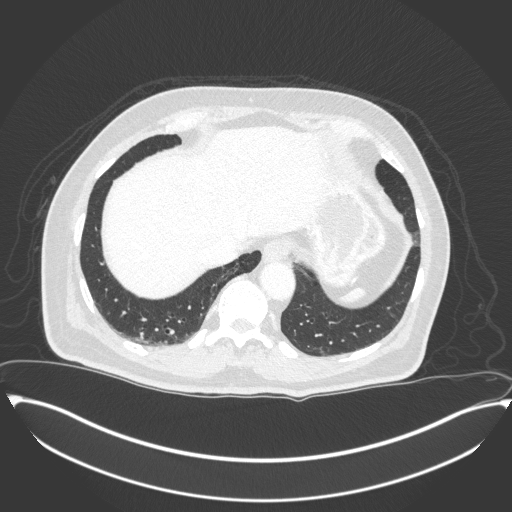

[Series 14: thins post · axial · 0.85mm/px · z∈[+944,+1004]mm · 2 of 967 slices shown (2 of 2)]
[im 121/967  soft-tissue]
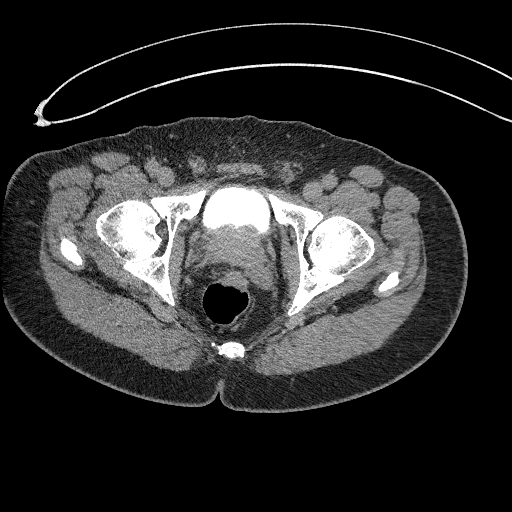
[im 242/967  soft-tissue]
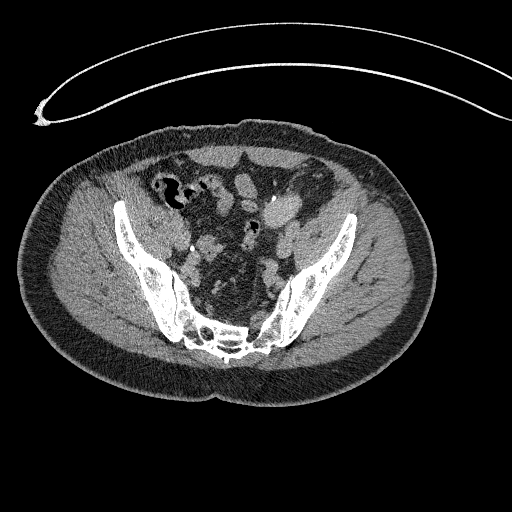

[11 of 46 positions shown; findings below may reference images not displayed]

RADIATION DOSE REDUCTION: This exam was performed according to the
departmental dose-optimization program which includes automated
exposure control, adjustment of the mA and/or kV according to
patient size and/or use of iterative reconstruction technique.

CONTRAST:  100mL OMNIPAQUE IOHEXOL 300 MG/ML  SOLN
FINDINGS: Lower chest: Varicoid bronchiectasis in the bilateral lower lobes
with small cystic spaces, reflecting sequela of
infection/inflammation related to known diagnosis of Lashkar
syndrome seen on prior chest CT. Focal emphysematous space in the
left lower lobe is unchanged from September 21, 2009 and likely reflects
congenital lobular emphysema.

Hepatobiliary: No suspicious hepatic lesion. Gallbladder is
unremarkable. No biliary ductal dilation.

Pancreas: No pancreatic ductal dilation or evidence of acute
inflammation.

Spleen: No splenomegaly or focal splenic lesion.

Adrenals/Urinary Tract: Pancake left adrenal gland. Right adrenal
gland appears normal.

Ectopic pelvic left kidney. No hydronephrosis. Punctate left renal
calculi. No obstructive ureteral or bladder calculi identified.

Benign left renal sinus cysts. Hypodense subcentimeter exophytic
right renal lesion is technically too small to accurately
characterize. No solid enhancing renal mass.

Kidneys demonstrate symmetric enhancement and excretion of contrast
material. No suspicious filling defect identified within the
opacified portions of the collecting systems or ureters on delayed
imaging.

Asymmetric wall thickening of the superior bladder wall for instance
on image 51/10 which becomes less conspicuous with increased
distension of the urinary bladder for instance on image 41/16
effacement of the urinary bladder by a mildly enlarged prostate
gland with minimal median lobe hypertrophy.

Stomach/Bowel: No radiopaque enteric contrast material was
administered. Stomach is unremarkable for degree of distension. No
pathologic dilation of small or large bowel. Terminal ileum appears
normal. Appendix is not confidently identified in its entirety
however there is no pericecal inflammation. Left-sided colonic
diverticulosis without findings of acute diverticulitis.

Vascular/Lymphatic: Scattered aortic atherosclerosis without
abdominal aortic aneurysm. No pathologically enlarged abdominal or
pelvic lymph nodes.

Reproductive: Mild enlargement of the prostate gland with
heterogeneous enhancement and median lobe hypertrophy measuring
x 3.9 x 5.9 cm (volume = 67 cm^3)

Other: No significant abdominopelvic free fluid. Small fat
containing left inguinal hernia

Musculoskeletal: Mild multilevel degenerative changes spine. No
acute osseous abnormality.
IMPRESSION: 1. No hydronephrosis.  Punctate nonobstructive left renal calculi.
2. No solid enhancing renal mass.
3. Asymmetric wall thickening of the superior bladder wall which
becomes less conspicuous with increased distension of the urinary
bladder, likely reflecting sequela of under distension. Consider
further evaluation with cystoscopy if clinically indicated.
4. Ectopic pelvic left kidney.
5. Left-sided colonic diverticulosis without findings of acute
diverticulitis.
6. Varicoid bronchiectasis in the bilateral lower lobes with small
cystic spaces, reflecting sequela of infection/inflammation related
to known diagnosis of Lashkar syndrome seen on prior chest CT
7.  Aortic Atherosclerosis ([id number]).

## 2024-03-23 ENCOUNTER — Ambulatory Visit: Attending: Cardiology | Admitting: Physician Assistant

## 2024-03-23 ENCOUNTER — Encounter: Payer: Self-pay | Admitting: Physician Assistant

## 2024-03-23 VITALS — BP 183/82 | HR 67 | Resp 16 | Ht 75.0 in | Wt 195.8 lb

## 2024-03-23 DIAGNOSIS — E119 Type 2 diabetes mellitus without complications: Secondary | ICD-10-CM

## 2024-03-23 DIAGNOSIS — Z86718 Personal history of other venous thrombosis and embolism: Secondary | ICD-10-CM | POA: Diagnosis not present

## 2024-03-23 DIAGNOSIS — I10 Essential (primary) hypertension: Secondary | ICD-10-CM | POA: Diagnosis not present

## 2024-03-23 DIAGNOSIS — E785 Hyperlipidemia, unspecified: Secondary | ICD-10-CM | POA: Diagnosis not present

## 2024-03-23 DIAGNOSIS — I351 Nonrheumatic aortic (valve) insufficiency: Secondary | ICD-10-CM | POA: Diagnosis not present

## 2024-03-23 MED ORDER — SPIRONOLACTONE 25 MG PO TABS: 25.0000 mg | ORAL_TABLET | Freq: Every day | ORAL | 3 refills | Status: AC

## 2024-03-23 NOTE — Progress Notes (Unsigned)
 Cardiology Office Note   Date:  03/25/2024  ID:  Jordan Goodwin, DOB [birth year], MRN [id number] PCP: Marvine Norleen, MD  [facility] Providers Cardiologist:  Plan to set up with Dr. Dorn Ross  History of Present Illness Jordan Goodwin is a 73 y.o. male with past medical history of left lower extremity DVT April 2011 involving left posterior tibial vein, elevated PSA followed by Dr. Matilda of urology, aortic valve heart murmur, hypothyroidism, hypertension, hyperlipidemia and DM2.  After his diagnosis of left lower extremity DVT and 2011, he was treated with Lovenox and Coumadin, however subsequently developed calf hematoma leading to compartment syndrome requiring fasciotomy on 09/18/2009.  He has spontaneous pulmonary hemorrhage on 09/22/2009.  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  He had a history of elevated PSA that was felt to be related to infection rather than cancer.  Echocardiogram in March 2012 showed mild to moderate aortic insufficiency, mild MR, mild TR.  Patient was last seen by Dr. Burnard in October 2024 at which time he was doing well.  Repeat echocardiogram performed on 05/07/2023 showed EF 55 to 60%, grade 1 DD, normal RV, moderate AI, mild aortic stenosis.  Patient presents today for follow-up. He denies any recent exertional chest pain or shortness of breath. Blood pressure has been elevated, he has not been taking amlodipine  due to intolerance. He says amlodipine  gave him a dry cough.  He is on metoprolol  succinate and irbesartan -HCTZ.  I will start him on spironolactone 25 mg daily for blood pressure control.  Spironolactone can further be increased on the next visit if blood pressure remain high.  He will need a basic metabolic panel in 2 weeks to reassess renal function and electrolyte.  If blood pressure control become a issue, we will consider renal artery ultrasound in the future.  He is due for repeat echocardiogram  in December of this year to follow-up on aortic regurgitation.  Otherwise, he appears to be euvolemic on exam. Since Dr. Burnard his primary cardiologist has retired, he wished to establish with a new cardiologist in [place] office.  I will have him seen in the [place] office by the APP in the next 6 weeks and he can establish with Dr. Dorn Ross in 6 months.   ROS:   He denies chest pain, palpitations, dyspnea, pnd, orthopnea, n, v, dizziness, syncope, edema, weight gain, or early satiety. All other systems reviewed and are otherwise negative except as noted above.    Studies Reviewed      Cardiac Studies & Procedures   ______________________________________________________________________________________________   STRESS TESTS  NM MYOCAR MULTI W/SPECT W 09/24/2009  Narrative Clinical Data:  Hypertension.  Deep venous thrombosis.  MYOCARDIAL IMAGING WITH SPECT (REST AND PHARMACOLOGIC-STRESS) GATED LEFT VENTRICULAR WALL MOTION STUDY LEFT VENTRICULAR EJECTION FRACTION  Standard myocardial SPECT imaging was performed after resting intravenous injection of Tc-35m tetrofosmin.  Subsequently, intravenous infusion of Lexiscan was performed under the supervision of cardiology staff.  At peak effect of the drug, of Tc-44m tetrofosmin was injected intravenously and standard myocardial SPECT imaging was performed.  Quantitative gated imaging was also performed to evaluate left ventricular wall motion and estimated left ventricular ejection fraction.  Comparison:  CT of 09/21/2009  Findings:  Rest images demonstrate normal left ventricular cavity size.  No fixed defects.  Stress images demonstrate no areas of reversibility to suggest inducible ischemia.  Evaluation of wall motion demonstrates normal left ventricular wall motion and  thickening.  Ejection fraction is estimated at 66%.  End diastolic volume of 120 cc.  End systolic volume of  41 cc.  IMPRESSION: 1.  No  fixed or reversible defects to suggest inducible ischemia. 2.  Normal left ventricular wall motion and thickening. 3.  Normal ejection fraction.  Provider: Channing Arias, Newell Lesches, Amber Saluda, [place] Stedge   ECHOCARDIOGRAM  ECHOCARDIOGRAM COMPLETE 05/07/2023  Narrative ECHOCARDIOGRAM REPORT    Patient Name:   Jordan Goodwin Date of Exam: 05/07/2023 Medical Rec #:  [id number]         Height:       75.0 in Accession #:    [id number]        Weight:       203.0 lb Date of Birth:  March 16, 1951         BSA:          2.208 m Patient Age:    72 years          BP:           122/80 mmHg Patient Gender: M                 HR:           64 bpm. Exam Location:  [facility]  Procedure: 2D Echo, Cardiac Doppler, Color Doppler and Strain Analysis  Indications:    R01.1 Murmur; I10 Hypertension  History:        Patient has prior history of Echocardiogram examinations, most recent 08/01/2010. Risk Factors:Hypertension, Diabetes, Dyslipidemia and Non-Smoker.  Sonographer:    Bascom Burows RCS, RVS Referring Phys: [phone] THOMAS A KELLY  IMPRESSIONS   1. Left ventricular ejection fraction, by estimation, is 55 to 60%. The left ventricle has normal function. Left ventricular endocardial border not optimally defined to evaluate regional wall motion. Left ventricular diastolic parameters are consistent with Grade I diastolic dysfunction (impaired relaxation). 2. Right ventricular systolic function is normal. The right ventricular size is normal. Tricuspid regurgitation signal is inadequate for assessing PA pressure. 3. The mitral valve is normal in structure. No evidence of mitral valve regurgitation. No evidence of mitral stenosis. 4. The aortic valve has an indeterminant number of cusps. Aortic valve regurgitation is moderate. Mild aortic valve stenosis. Aortic regurgitation PHT measures 320 msec. Aortic valve area, by VTI measures 1.91 cm. Aortic valve mean gradient measures 10.0 mmHg. Aortic valve  Vmax measures 2.09 m/s. 5. The inferior vena cava is normal in size with greater than 50% respiratory variability, suggesting right atrial pressure of 3 mmHg.  FINDINGS Left Ventricle: Left ventricular ejection fraction, by estimation, is 55 to 60%. The left ventricle has normal function. Left ventricular endocardial border not optimally defined to evaluate regional wall motion. The left ventricular internal cavity size was normal in size. There is no left ventricular hypertrophy. Left ventricular diastolic parameters are consistent with Grade I diastolic dysfunction (impaired relaxation). Normal left ventricular filling pressure.  Right Ventricle: The right ventricular size is normal. No increase in right ventricular wall thickness. Right ventricular systolic function is normal. Tricuspid regurgitation signal is inadequate for assessing PA pressure.  Left Atrium: Left atrial size was normal in size.  Right Atrium: Right atrial size was normal in size.  Pericardium: There is no evidence of pericardial effusion.  Mitral Valve: The mitral valve is normal in structure. No evidence of mitral valve regurgitation. No evidence of mitral valve stenosis. MV peak gradient, 2.1 mmHg. The mean mitral valve gradient is 1.0 mmHg.  Tricuspid Valve:  The tricuspid valve is normal in structure. Tricuspid valve regurgitation is trivial. No evidence of tricuspid stenosis.  Aortic Valve: The aortic valve has an indeterminant number of cusps. Aortic valve regurgitation is moderate. Aortic regurgitation PHT measures 320 msec. Mild aortic stenosis is present. Aortic valve mean gradient measures 10.0 mmHg. Aortic valve peak gradient measures 17.5 mmHg. Aortic valve area, by VTI measures 1.91 cm.  Pulmonic Valve: The pulmonic valve was normal in structure. Pulmonic valve regurgitation is trivial. No evidence of pulmonic stenosis.  Aorta: The aortic root and ascending aorta are structurally normal, with no evidence of  dilitation.  Venous: The inferior vena cava is normal in size with greater than 50% respiratory variability, suggesting right atrial pressure of 3 mmHg.  IAS/Shunts: No atrial level shunt detected by color flow Doppler.   LEFT VENTRICLE PLAX 2D LVIDd:         5.10 cm      Diastology LVIDs:         3.60 cm      LV e' medial:    7.07 cm/s LV PW:         1.10 cm      LV E/e' medial:  7.3 LV IVS:        1.10 cm      LV e' lateral:   8.16 cm/s LVOT diam:     2.00 cm      LV E/e' lateral: 6.3 LV SV:         87 LV SV Index:   40 LVOT Area:     3.14 cm  LV Volumes (MOD) LV vol d, MOD A2C: 82.6 ml LV vol d, MOD A4C: 101.0 ml LV vol s, MOD A2C: 36.0 ml LV vol s, MOD A4C: 45.6 ml LV SV MOD A2C:     46.6 ml LV SV MOD A4C:     101.0 ml LV SV MOD BP:      56.4 ml  RIGHT VENTRICLE RV Basal diam:  2.70 cm RV Mid diam:    2.80 cm RV S prime:     12.10 cm/s TAPSE (M-mode): 2.1 cm  LEFT ATRIUM             Index        RIGHT ATRIUM           Index LA diam:        3.30 cm 1.49 cm/m   RA Area:     17.70 cm LA Vol (A2C):   42.4 ml 19.20 ml/m  RA Volume:   42.50 ml  19.25 ml/m LA Vol (A4C):   31.8 ml 14.40 ml/m LA Biplane Vol: 39.5 ml 17.89 ml/m AORTIC VALVE                     PULMONIC VALVE AV Area (Vmax):    2.16 cm      PV Vmax:       0.72 m/s AV Area (Vmean):   2.11 cm      PV Peak grad:  2.0 mmHg AV Area (VTI):     1.91 cm AV Vmax:           209.00 cm/s AV Vmean:          142.000 cm/s AV VTI:            0.457 m AV Peak Grad:      17.5 mmHg AV Mean Grad:      10.0 mmHg LVOT Vmax:  144.00 cm/s LVOT Vmean:        95.200 cm/s LVOT VTI:          0.278 m LVOT/AV VTI ratio: 0.61 AI PHT:            320 msec  AORTA Ao Root diam: 3.60 cm Ao Asc diam:  3.50 cm  MITRAL VALVE MV Area (PHT): 3.23 cm    SHUNTS MV Area VTI:   4.12 cm    Systemic VTI:  0.28 m MV Peak grad:  2.1 mmHg    Systemic Diam: 2.00 cm MV Mean grad:  1.0 mmHg MV Vmax:       0.73 m/s MV Vmean:       48.3 cm/s MV Decel Time: 235 msec MV E velocity: 51.80 cm/s MV A velocity: 75.00 cm/s MV E/A ratio:  0.69  Vishnu Priya Mallipeddi Electronically signed by Diannah Late Mallipeddi Signature Date/Time: 05/08/2023/10:35:34 AM    Final          ______________________________________________________________________________________________      Risk Assessment/Calculations          Physical Exam VS:  BP (!) 183/82 (BP Location: Left Arm, Patient Position: Sitting, Cuff Size: Normal)   Pulse 67   Resp 16   Ht 6' 3 (1.905 m)   Wt 195 lb 12.8 oz (88.8 kg)   SpO2 99%   BMI 24.47 kg/m        Wt Readings from Last 3 Encounters:  03/23/24 195 lb 12.8 oz (88.8 kg)  03/24/23 203 lb (92.1 kg)  01/31/21 208 lb 9.6 oz (94.6 kg)    GEN: Well nourished, well developed in no acute distress NECK: No JVD; No carotid bruits CARDIAC: RRR, no murmurs, rubs, gallops RESPIRATORY:  Clear to auscultation without rales, wheezing or rhonchi  ABDOMEN: Soft, non-tender, non-distended EXTREMITIES:  No edema; No deformity   ASSESSMENT AND PLAN  Aortic insufficiency: Due for repeat echocardiogram around December of this year  Hypertension: Patient came off of amlodipine  due to nonproductive cough, I am not sure if his nonproductive cough is truly the side effect of amlodipine  as it is a very rare presentation.  I will start him on 25 mg of spironolactone.  He will return in 4 to 6 weeks to further uptitrate blood pressure medication  Hyperlipidemia: On atorvastatin  40 mg daily  DM2: Continue Janumet.  Followed by primary care provider  History of DVT: Occurred in 2011.  No recurrence.  He was on blood thinner briefly, however developed calf hematoma leading to compartment syndrome requiring fasciotomy and also spontaneous pulmonary hemorrhage.        Dispo: Follow-up with [facility] in 6 weeks, establish with Dr. Dorn Ross in 6 months.  Signed, Scot Ford, PA

## 2024-03-23 NOTE — Patient Instructions (Addendum)
 Medication Instructions:   START TAKING :  SPIRONOLACTONE  25 MG ONCE A DAY    *If you need a refill on your cardiac medications before your next appointment, please call your pharmacy*  Lab Work:  IN  2 WEEKS  GET  BMET  AT YOU R NEAREST  [facility]     If you have labs (blood work) drawn today and your tests are completely normal, you will receive your results only by: MyChart Message (if you have MyChart) OR A paper copy in the mail If you have any lab test that is abnormal or we need to change your treatment, we will call you to review the results.  Testing/Procedures: Your physician has requested that you have an echocardiogram. Echocardiography is a painless test that uses sound waves to create images of your heart. It provides your doctor with information about the size and shape of your heart and how well your heart's chambers and valves are working. This procedure takes approximately one hour. There are no restrictions for this procedure. Please do NOT wear cologne, perfume, aftershave, or lotions (deodorant is allowed). Please arrive 15 minutes prior to your appointment time.  Please note: We ask at that you not bring children with you during ultrasound (echo/ vascular) testing. Due to room size and safety concerns, children are not allowed in the ultrasound rooms during exams. Our front office staff cannot provide observation of children in our lobby area while testing is being conducted. An adult accompanying a patient to their appointment will only be allowed in the ultrasound room at the discretion of the ultrasound technician under special circumstances. We apologize for any inconvenience.    Follow-Up: At [facility], you and your health needs are our priority.  As part of our continuing mission to provide you with exceptional heart care, our providers are all part of one team.  This team includes your primary Cardiologist (physician) and Advanced Practice Providers or  APPs (Physician Assistants and Nurse Practitioners) who all work together to provide you with the care you need, when you need it.  Your next appointment:   6  week(s)   [facility]  AND 6 MONTHS  Provider:   You may see Dr. Alvan   We recommend signing up for the patient portal called MyChart.  Sign up information is provided on this After Visit Summary.  MyChart is used to connect with patients for Virtual Visits (Telemedicine).  Patients are able to view lab/test results, encounter notes, upcoming appointments, etc.  Non-urgent messages can be sent to your provider as well.   To learn more about what you can do with MyChart, go to [email or web address].   Other Instructions

## 2024-03-29 DIAGNOSIS — C61 Malignant neoplasm of prostate: Secondary | ICD-10-CM | POA: Diagnosis not present

## 2024-03-29 DIAGNOSIS — R972 Elevated prostate specific antigen [PSA]: Secondary | ICD-10-CM | POA: Diagnosis not present

## 2024-03-29 DIAGNOSIS — N4231 Prostatic intraepithelial neoplasia: Secondary | ICD-10-CM | POA: Diagnosis not present

## 2024-03-29 DIAGNOSIS — R399 Unspecified symptoms and signs involving the genitourinary system: Secondary | ICD-10-CM | POA: Diagnosis not present

## 2024-04-05 LAB — BASIC METABOLIC PANEL WITH GFR
BUN/Creatinine Ratio: 16 (ref 10–24)
BUN: 16 mg/dL (ref 8–27)
CO2: 23 mmol/L (ref 20–29)
Calcium: 9.1 mg/dL (ref 8.6–10.2)
Chloride: 102 mmol/L (ref 96–106)
Creatinine, Ser: 0.98 mg/dL (ref 0.76–1.27)
Glucose: 170 mg/dL — ABNORMAL HIGH (ref 70–99)
Potassium: 4.7 mmol/L (ref 3.5–5.2)
Sodium: 141 mmol/L (ref 134–144)
eGFR: 81 mL/min/1.73 (ref >59)

## 2024-04-06 ENCOUNTER — Ambulatory Visit: Payer: Self-pay | Admitting: Physician Assistant

## 2024-04-06 NOTE — Progress Notes (Signed)
Stable renal function and electrolyte

## 2024-04-13 DIAGNOSIS — E782 Mixed hyperlipidemia: Secondary | ICD-10-CM | POA: Diagnosis not present

## 2024-04-13 DIAGNOSIS — I10 Essential (primary) hypertension: Secondary | ICD-10-CM | POA: Diagnosis not present

## 2024-04-13 DIAGNOSIS — Z86718 Personal history of other venous thrombosis and embolism: Secondary | ICD-10-CM | POA: Diagnosis not present

## 2024-04-13 DIAGNOSIS — M48061 Spinal stenosis, lumbar region without neurogenic claudication: Secondary | ICD-10-CM | POA: Diagnosis not present

## 2024-04-13 DIAGNOSIS — H409 Unspecified glaucoma: Secondary | ICD-10-CM | POA: Diagnosis not present

## 2024-04-13 DIAGNOSIS — E7849 Other hyperlipidemia: Secondary | ICD-10-CM | POA: Diagnosis not present

## 2024-04-13 DIAGNOSIS — E1169 Type 2 diabetes mellitus with other specified complication: Secondary | ICD-10-CM | POA: Diagnosis not present

## 2024-04-13 DIAGNOSIS — Z139 Encounter for screening, unspecified: Secondary | ICD-10-CM | POA: Diagnosis not present

## 2024-04-13 DIAGNOSIS — C61 Malignant neoplasm of prostate: Secondary | ICD-10-CM | POA: Diagnosis not present

## 2024-04-13 DIAGNOSIS — E039 Hypothyroidism, unspecified: Secondary | ICD-10-CM | POA: Diagnosis not present

## 2024-04-13 DIAGNOSIS — Z6825 Body mass index (BMI) 25.0-25.9, adult: Secondary | ICD-10-CM | POA: Diagnosis not present

## 2024-04-18 ENCOUNTER — Encounter: Payer: Self-pay | Admitting: Urology

## 2024-04-19 ENCOUNTER — Ambulatory Visit: Admitting: Urology

## 2024-04-26 NOTE — Progress Notes (Signed)
 PSA on 1.12.2022--5.0 @ PCP office  He knows about his biopsy result.  We are waiting on decipher of his specimen.

## 2024-05-09 ENCOUNTER — Ambulatory Visit: Attending: Physician Assistant

## 2024-05-09 DIAGNOSIS — I351 Nonrheumatic aortic (valve) insufficiency: Secondary | ICD-10-CM | POA: Diagnosis not present

## 2024-05-09 LAB — ECHOCARDIOGRAM COMPLETE
AR max vel: 2.35 cm2
AV Mean grad: 8 mmHg
AV Peak grad: 14 mmHg
AV Vena cont: 0.7 cm
Ao pk vel: 1.87 m/s
Area-P 1/2: 2.4 cm2
Calc EF: 61.4 %
P 1/2 time: 313 ms
S' Lateral: 3.4 cm
Single Plane A2C EF: 63.2 %
Single Plane A4C EF: 58.6 %

## 2024-05-24 ENCOUNTER — Telehealth: Payer: Self-pay

## 2024-05-24 NOTE — Telephone Encounter (Signed)
 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

## 2024-05-29 ENCOUNTER — Telehealth: Payer: Self-pay

## 2024-05-29 NOTE — Telephone Encounter (Signed)
 SABRA

## 2024-05-30 NOTE — Telephone Encounter (Signed)
 Secondary pathology report attached and gleason score entered on website

## 2024-06-07 ENCOUNTER — Encounter: Payer: Self-pay | Admitting: Nurse Practitioner

## 2024-06-07 ENCOUNTER — Ambulatory Visit: Attending: Nurse Practitioner | Admitting: Nurse Practitioner

## 2024-06-07 VITALS — BP 128/82 | HR 84 | Ht 75.0 in | Wt 202.2 lb

## 2024-06-07 DIAGNOSIS — E785 Hyperlipidemia, unspecified: Secondary | ICD-10-CM | POA: Diagnosis not present

## 2024-06-07 DIAGNOSIS — I77819 Aortic ectasia, unspecified site: Secondary | ICD-10-CM | POA: Diagnosis not present

## 2024-06-07 DIAGNOSIS — I351 Nonrheumatic aortic (valve) insufficiency: Secondary | ICD-10-CM

## 2024-06-07 DIAGNOSIS — I10 Essential (primary) hypertension: Secondary | ICD-10-CM

## 2024-06-07 DIAGNOSIS — Z86718 Personal history of other venous thrombosis and embolism: Secondary | ICD-10-CM | POA: Diagnosis not present

## 2024-06-07 NOTE — Patient Instructions (Signed)
 Medication Instructions:  Continue all current medications.   Labwork: none  Testing/Procedures: none  Follow-Up: 6 months   Any Other Special Instructions Will Be Listed Below (If Applicable).   If you need a refill on your cardiac medications before your next appointment, please call your pharmacy.

## 2024-06-07 NOTE — Progress Notes (Signed)
 " Cardiology Office Note   Date: 06/07/2024 ID:  TIJUAN Goodwin, DOB Jan 12, 1951, MRN [id number] PCP: Lari Elspeth BRAVO, MD  [facility] Providers Cardiologist:  Diannah SHAUNNA Maywood, MD     History of Present Illness Jordan Goodwin is a 74 y.o. male with a PMH of aortic valve disease, hypertension, hyperlipidemia, type 2 diabetes, history of DVT, history of hematoma leading to compartment syndrome requiring fasciotomy in 2011, history of spontaneous pulmonary hemorrhage in 2011, hemoglobin C hemoglobinopathy, elevated PSA (followed by Urology), who presents today for 6 week follow-up.   Was diagnosed with a left lower extremity DVT in 2011 and was treated with Lovenox and Coumadin.  However, he subsequently developed calf hematoma leading to compartment syndrome requiring fasciotomy in April 2011.  He also had spontaneous pulmonary hemorrhage later that month.  He had a low MCV level and then diagnosed with hemoglobin C hemoglobinopathy.   Last seen by Scot Ford, PA-C on March 23, 2024.  Was overall doing well at the time, however blood pressures have been elevated.  Was found not to be taking amlodipine  due to intolerance as this medication gave him a dry cough.  He was started on Aldactone  25 mg daily for blood pressure control.  Echo was arranged in December 2025 to evaluate his aortic valve.  See full report below.  He is here for 6-week follow-up.  He states he is doing well. BP's have been well controlled on Spironolactone . Tolerating medication well. Denies any chest pain, shortness of breath, palpitations, syncope, presyncope, dizziness, orthopnea, PND, swelling or significant weight changes, acute bleeding, or claudication.   ROS: Negative. See HPI.   Studies Reviewed  EKG: EKG is not ordered today.   Echo 05/2024:   1. Left ventricular ejection fraction, by estimation, is 55 to 60%. The  left ventricle has normal function. The left ventricle has no regional  wall  motion abnormalities. There is mild concentric left ventricular  hypertrophy. Left ventricular diastolic  parameters are consistent with Grade I diastolic dysfunction (impaired  relaxation).   2. Right ventricular systolic function is normal. The right ventricular  size is normal. Tricuspid regurgitation signal is inadequate for assessing  PA pressure.   3. The mitral valve is grossly normal. Trivial mitral valve  regurgitation.   4. The aortic valve is possibly bicuspid. There is mild calcification of  the aortic valve. Aortic valve regurgitation is eccentric and likely  moderate overall (AR/LVOT jet diameter approximately 50%). Very mild  aortic valve stenosis. Aortic valve mean  gradient measures 8.0 mmHg.   5. Aortic dilatation noted. There is mild dilatation of the ascending  aorta, measuring 38 mm.   6. The inferior vena cava is normal in size with greater than 50%  respiratory variability, suggesting right atrial pressure of 3 mmHg.   Lexiscan 08/2009:  IMPRESSION:  1. No fixed or reversible defects to suggest inducible ischemia.  2.  Normal left ventricular wall motion and thickening.  3.  Normal ejection fraction.  Physical Exam VS:  BP 128/82   Pulse 84   Ht 6' 3 (1.905 m)   Wt 202 lb 3.2 oz (91.7 kg)   SpO2 98%   BMI 25.27 kg/m        Wt Readings from Last 3 Encounters:  06/07/24 202 lb 3.2 oz (91.7 kg)  03/23/24 195 lb 12.8 oz (88.8 kg)  03/24/23 203 lb (92.1 kg)    GEN: Well nourished, well developed in no acute distress NECK: No  JVD; No carotid bruits CARDIAC: S1/S2, RRR, no murmurs, rubs, gallops RESPIRATORY:  Clear to auscultation without rales, wheezing or rhonchi  ABDOMEN: Soft, non-tender, non-distended EXTREMITIES:  No edema; No deformity   ASSESSMENT AND PLAN  HTN BP is stable and at goal. Discussed to monitor BP at home at least 2 hours after medications and sitting for 5-10 minutes. Continue current medication regimen. Heart healthy diet and  regular cardiovascular exercise encouraged.  Continue to follow with PCP.  Aortic valve regurgitation Most recent Echo showed moderate aortic valve regurgitation, possibly felt to be bicuspid. Denies any symptoms. Will continue to monitor. Plan to update Echo in 1 year or sooner if clinically indicated.   HLD LDL 60 from August 2025. Continue Lipitor. Heart healthy diet and regular cardiovascular exercise encouraged.  Continue to follow with PCP.  Hx of DVT No recurrence, occurred in 2011. Was on blood thinners for a brief period, but developed calf hematoma and compartment syndrome, requiring fasciotomy and spontaneous pulmonary hemorrhage. Denies any issues. Continue to follow with PCP.  Aortic dilatation   Noted on most recent Echo, mild dilatation of ascending aorta measuring 38 mm. Plan to update Echo/imaging in 1 year for monitoring.    Dispo: Follow-up with MD/APP in 6 months or sooner if anything changes.   Signed, Almarie Crate, NP   "

## 2024-06-29 ENCOUNTER — Telehealth: Payer: Self-pay

## 2024-06-29 NOTE — Telephone Encounter (Signed)
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 It is OK to continue w/ no treastment. Will discuss @ his appt in March. OK to send him copy of results ----- Message ----- From: Sammie Exie HERO, CMA Sent: 06/22/2024  11:37 AM EST To: Garnette Shack, MD  Shack Garnette, MD  Sammie Exie HERO, CMA; [place], Norma J Ladana Chavero--please call patient and let him know that the decipher results are back.  It verifies that he has a very low risk prostate cancer.  Have him keep the scheduled appointment in March  Norma-please send copy to patient

## 2024-06-29 NOTE — Telephone Encounter (Signed)
 Called patient to give results per MD Dahlstedt patient voiced his understanding and asked his results be mailed to him results mailed

## 2024-08-09 ENCOUNTER — Ambulatory Visit: Admitting: Urology
